# Patient Record
Sex: Female | Born: 1940 | Race: Asian | Hispanic: No | Smoking: Never smoker
Health system: Southern US, Community
[De-identification: ages and names within clinical notes are randomized; demographics above are authoritative.]

## PROBLEM LIST (undated history)

## (undated) DIAGNOSIS — M1990 Unspecified osteoarthritis, unspecified site: Secondary | ICD-10-CM

## (undated) DIAGNOSIS — E785 Hyperlipidemia, unspecified: Secondary | ICD-10-CM

## (undated) DIAGNOSIS — R011 Cardiac murmur, unspecified: Secondary | ICD-10-CM

## (undated) DIAGNOSIS — I10 Essential (primary) hypertension: Secondary | ICD-10-CM

## (undated) DIAGNOSIS — E119 Type 2 diabetes mellitus without complications: Secondary | ICD-10-CM

## (undated) HISTORY — DX: Cardiac murmur, unspecified: R01.1

## (undated) HISTORY — PX: ABDOMINAL HYSTERECTOMY: SHX81

## (undated) HISTORY — DX: Hyperlipidemia, unspecified: E78.5

---

## 2002-12-19 ENCOUNTER — Encounter: Payer: Self-pay | Admitting: Internal Medicine

## 2002-12-19 ENCOUNTER — Ambulatory Visit (HOSPITAL_COMMUNITY): Admission: RE | Admit: 2002-12-19 | Discharge: 2002-12-19 | Payer: Self-pay | Admitting: Internal Medicine

## 2002-12-22 ENCOUNTER — Encounter: Payer: Self-pay | Admitting: Internal Medicine

## 2002-12-22 ENCOUNTER — Ambulatory Visit (HOSPITAL_COMMUNITY): Admission: RE | Admit: 2002-12-22 | Discharge: 2002-12-22 | Payer: Self-pay | Admitting: Internal Medicine

## 2003-08-10 ENCOUNTER — Ambulatory Visit (HOSPITAL_COMMUNITY): Admission: RE | Admit: 2003-08-10 | Discharge: 2003-08-10 | Payer: Self-pay | Admitting: Internal Medicine

## 2003-09-20 ENCOUNTER — Ambulatory Visit (HOSPITAL_COMMUNITY): Admission: RE | Admit: 2003-09-20 | Discharge: 2003-09-20 | Payer: Self-pay | Admitting: Internal Medicine

## 2004-05-05 ENCOUNTER — Ambulatory Visit: Payer: Self-pay | Admitting: Internal Medicine

## 2004-05-27 ENCOUNTER — Ambulatory Visit: Payer: Self-pay | Admitting: *Deleted

## 2004-05-30 ENCOUNTER — Ambulatory Visit: Payer: Self-pay | Admitting: Internal Medicine

## 2004-08-04 ENCOUNTER — Ambulatory Visit: Payer: Self-pay | Admitting: Internal Medicine

## 2004-12-11 ENCOUNTER — Ambulatory Visit: Payer: Self-pay | Admitting: Internal Medicine

## 2005-01-05 ENCOUNTER — Ambulatory Visit: Payer: Self-pay | Admitting: Internal Medicine

## 2005-04-13 ENCOUNTER — Ambulatory Visit: Payer: Self-pay | Admitting: Internal Medicine

## 2009-02-05 ENCOUNTER — Encounter: Admission: RE | Admit: 2009-02-05 | Discharge: 2009-02-05 | Payer: Self-pay | Admitting: Specialist

## 2012-06-08 ENCOUNTER — Encounter (HOSPITAL_COMMUNITY): Payer: Self-pay | Admitting: Pharmacy Technician

## 2012-06-14 ENCOUNTER — Ambulatory Visit (HOSPITAL_COMMUNITY)
Admission: RE | Admit: 2012-06-14 | Discharge: 2012-06-14 | Disposition: A | Discharge status: Home / Self Care | Payer: Medicare Other | Source: Ambulatory Visit | Attending: Orthopedic Surgery | Admitting: Orthopedic Surgery

## 2012-06-14 ENCOUNTER — Encounter (HOSPITAL_COMMUNITY): Payer: Self-pay

## 2012-06-14 ENCOUNTER — Encounter (HOSPITAL_COMMUNITY)
Admission: RE | Admit: 2012-06-14 | Discharge: 2012-06-14 | Disposition: A | Discharge status: Home / Self Care | Payer: Medicare Other | Source: Ambulatory Visit | Attending: Orthopedic Surgery | Admitting: Orthopedic Surgery

## 2012-06-14 ENCOUNTER — Other Ambulatory Visit: Payer: Self-pay

## 2012-06-14 DIAGNOSIS — M4120 Other idiopathic scoliosis, site unspecified: Secondary | ICD-10-CM | POA: Insufficient documentation

## 2012-06-14 HISTORY — DX: Essential (primary) hypertension: I10

## 2012-06-14 HISTORY — DX: Unspecified osteoarthritis, unspecified site: M19.90

## 2012-06-14 HISTORY — DX: Type 2 diabetes mellitus without complications: E11.9

## 2012-06-14 LAB — URINALYSIS, ROUTINE W REFLEX MICROSCOPIC
Glucose, UA: NEGATIVE mg/dL
Hgb urine dipstick: NEGATIVE
Ketones, ur: NEGATIVE mg/dL
Leukocytes, UA: NEGATIVE
pH: 7 (ref 5.0–8.0)

## 2012-06-14 LAB — CBC
HCT: 39.2 % (ref 36.0–46.0)
MCH: 32 pg (ref 26.0–34.0)
MCHC: 34.2 g/dL (ref 30.0–36.0)
MCV: 93.6 fL (ref 78.0–100.0)
RDW: 12.7 % (ref 11.5–15.5)
WBC: 6.8 10*3/uL (ref 4.0–10.5)

## 2012-06-14 LAB — BASIC METABOLIC PANEL
BUN: 19 mg/dL (ref 6–23)
CO2: 25 mEq/L (ref 19–32)
Chloride: 102 mEq/L (ref 96–112)
Creatinine, Ser: 0.63 mg/dL (ref 0.50–1.10)
Glucose, Bld: 105 mg/dL — ABNORMAL HIGH (ref 70–99)

## 2012-06-14 LAB — SURGICAL PCR SCREEN
MRSA, PCR: NEGATIVE
Staphylococcus aureus: NEGATIVE

## 2012-06-14 NOTE — Patient Instructions (Addendum)
20      Your procedure is scheduled on:  Monday 06/20/2012 at 220 pm  Report to [facility] at 1200 pm  Call this number if you have problems the morning of surgery: [phone]   Remember:   Do not eat food after midnight! FROM MIDNIGHT UNTIL 0820 AM MORNING OF SURGERY ,YOU CAN HAVE CLEAR LIQUIDS THEN NOTHING UNTIL AFTER YOUR SURGERY! (clear liquid diet provided)  Take these medicines the morning of surgery with A SIP OF WATER: NONE   Do not bring valuables to the hospital.  .  Leave suitcase in the car. After surgery it may be brought to your room.  For patients admitted to the hospital, checkout time is 11:00 AM the day of              Discharge.    Special Instructions: See [facility] Preparing  For Surgery Instruction Sheet. Do not wear jewelry, lotions powders, perfumes. Women do not shave legs or underarms for 12 hours before showers. Contacts, partial plates, or dentures may not be worn into surgery.                          Patients discharged the day of surgery will not be allowed to drive home.   If going home the same day of surgery, must have someone stay with you first 24 hrs.at home and arrange for someone to drive you home from the Hospital.                Please read over the following fact sheets that you were given: MRSA              INFORMATION, Blood Transfusion sheet, Sleep Apnea Sheet, Incentive Spirometry Sheet               Telford Nab.Shalondra Wunschel,RN,BSN [phone]

## 2012-06-19 NOTE — H&P (Signed)
TOTAL KNEE ADMISSION H&P  Patient is being admitted for right total knee arthroplasty.  Subjective:  Chief Complaint:right knee pain.  HPI: Alexandra Bean, 71 y.o. female, has a history of pain and functional disability in the right knee due to arthritis and has failed non-surgical conservative treatments for greater than 12 weeks to includeNSAID's and/or analgesics, corticosteriod injections and activity modification.  Onset of symptoms was gradual, starting 2 years ago with gradually worsening course since that time. The patient noted no past surgery on the right knee(s).  Patient currently rates pain in the right knee(s) at 7 out of 10 with activity. Patient has worsening of pain with activity and weight bearing, pain that interferes with activities of daily living and pain with passive range of motion.  Patient has evidence of periarticular osteophytes and joint space narrowing by imaging studies. Risks, benefits and expectations were discussed with the patient. Patient understand the risks, benefits and expectations and wishes to proceed with surgery.  D/C Plans:  Home with HHPT  Post-op Meds:  No Rx given   Tranexamic Acid:  To be given  Decadron:   Not to be given - DM    Past Medical History  Diagnosis Date  . Hypertension   . Diabetes mellitus without complication   . Arthritis     Past Surgical History  Procedure Date  . 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 Smoking status: Not on file  . Smokeless tobacco: Not on file  . Alcohol Use: No      Review of Systems  Constitutional: Negative.   HENT: Negative.   Eyes: Negative.   Respiratory: Negative.   Cardiovascular: Negative.   Gastrointestinal: Negative.   Genitourinary: Negative.   Musculoskeletal: Positive for myalgias and joint pain.  Skin: Negative.   Neurological:  Negative.   Endo/Heme/Allergies: Negative.   Psychiatric/Behavioral: Negative.     Objective:  Physical Exam  Constitutional: She is oriented to person, place, and time. She appears well-developed and well-nourished.  HENT:  Head: Normocephalic and atraumatic.  Nose: Nose normal.  Mouth/Throat: Oropharynx is clear and moist.  Eyes: Pupils are equal, round, and reactive to light.  Neck: Neck supple. No JVD present. No tracheal deviation present. No thyromegaly present.  Cardiovascular: Normal rate, regular rhythm, normal heart sounds and intact distal pulses.   Respiratory: Effort normal and breath sounds normal. No stridor. No respiratory distress. She has no wheezes.  GI: Soft. There is no tenderness. There is no guarding.  Musculoskeletal:       Right knee: She exhibits swelling and bony tenderness. She exhibits normal range of motion, no effusion, no deformity, no laceration and no erythema. tenderness found.  Lymphadenopathy:    She has no cervical adenopathy.  Neurological: She is alert and oriented to person, place, and time.  Skin: Skin is warm and dry.  Psychiatric: She has a normal mood and affect.    Vital signs in last 24 hours: BP : 127/70 ; HR : 88 ; Resp : 14 ;   Imaging Review Plain radiographs demonstrate moderate degenerative joint disease of the right knee(s). The overall alignment isneutral. The bone quality appears to be good for age and reported activity level.  Assessment/Plan:  End stage arthritis, right knee   The patient history, physical examination, clinical judgment  of the provider and imaging studies are consistent with end stage degenerative joint disease of the right knee(s) and total knee arthroplasty is deemed medically necessary. The treatment options including medical management, injection therapy arthroscopy and arthroplasty were discussed at length. The risks and benefits of total knee arthroplasty were presented and reviewed. The risks due  to aseptic loosening, infection, stiffness, patella tracking problems, thromboembolic complications and other imponderables were discussed. The patient acknowledged the explanation, agreed to proceed with the plan and consent was signed. Patient is being admitted for inpatient treatment for surgery, pain control, PT, OT, prophylactic antibiotics, VTE prophylaxis, progressive ambulation and ADL's and discharge planning. The patient is planning to be discharged home with home health services.    Alexandra Bean Alexandra Bean   PAC  06/19/2012, 5:49 PM

## 2012-06-20 ENCOUNTER — Encounter (HOSPITAL_COMMUNITY): Payer: Self-pay | Admitting: Anesthesiology

## 2012-06-20 ENCOUNTER — Encounter (HOSPITAL_COMMUNITY): Payer: Self-pay | Admitting: *Deleted

## 2012-06-20 ENCOUNTER — Inpatient Hospital Stay (HOSPITAL_COMMUNITY)
Admission: RE | Admit: 2012-06-20 | Discharge: 2012-06-22 | DRG: 470 | Disposition: A | Discharge status: Home Health | Payer: Medicare Other | Source: Ambulatory Visit | Attending: Orthopedic Surgery | Admitting: Orthopedic Surgery

## 2012-06-20 ENCOUNTER — Inpatient Hospital Stay (HOSPITAL_COMMUNITY): Payer: Medicare Other | Admitting: Anesthesiology

## 2012-06-20 ENCOUNTER — Encounter (HOSPITAL_COMMUNITY)
Admission: RE | Disposition: A | Discharge status: Home Health | Payer: Self-pay | Source: Ambulatory Visit | Attending: Orthopedic Surgery

## 2012-06-20 DIAGNOSIS — D62 Acute posthemorrhagic anemia: Secondary | ICD-10-CM | POA: Diagnosis not present

## 2012-06-20 DIAGNOSIS — M1710 Unilateral primary osteoarthritis, unspecified knee: Principal | ICD-10-CM | POA: Diagnosis present

## 2012-06-20 DIAGNOSIS — D500 Iron deficiency anemia secondary to blood loss (chronic): Secondary | ICD-10-CM

## 2012-06-20 DIAGNOSIS — E119 Type 2 diabetes mellitus without complications: Secondary | ICD-10-CM | POA: Diagnosis present

## 2012-06-20 DIAGNOSIS — E875 Hyperkalemia: Secondary | ICD-10-CM

## 2012-06-20 DIAGNOSIS — Z96659 Presence of unspecified artificial knee joint: Secondary | ICD-10-CM

## 2012-06-20 DIAGNOSIS — E663 Overweight: Secondary | ICD-10-CM

## 2012-06-20 DIAGNOSIS — E871 Hypo-osmolality and hyponatremia: Secondary | ICD-10-CM

## 2012-06-20 DIAGNOSIS — I10 Essential (primary) hypertension: Secondary | ICD-10-CM | POA: Diagnosis present

## 2012-06-20 HISTORY — PX: TOTAL KNEE ARTHROPLASTY: SHX125

## 2012-06-20 LAB — GLUCOSE, CAPILLARY
Glucose-Capillary: 112 mg/dL — ABNORMAL HIGH (ref 70–99)
Glucose-Capillary: 134 mg/dL — ABNORMAL HIGH (ref 70–99)
Glucose-Capillary: 257 mg/dL — ABNORMAL HIGH (ref 70–99)

## 2012-06-20 LAB — TYPE AND SCREEN: ABO/RH(D): O POS

## 2012-06-20 SURGERY — ARTHROPLASTY, KNEE, TOTAL
Anesthesia: Spinal | Site: Knee | Laterality: Right | Wound class: Clean

## 2012-06-20 MED ORDER — FLEET ENEMA 7-19 GM/118ML RE ENEM: 1.0000 | ENEMA | Freq: Once | RECTAL | Status: AC | PRN

## 2012-06-20 MED ORDER — PHENOL 1.4 % MT LIQD: 1.0000 | OROMUCOSAL | Status: DC | PRN

## 2012-06-20 MED ORDER — ACETAMINOPHEN 10 MG/ML IV SOLN
INTRAVENOUS | Status: DC | PRN
  Administered 2012-06-20: 1000 mg via INTRAVENOUS

## 2012-06-20 MED ORDER — ONDANSETRON HCL 4 MG/2ML IJ SOLN
INTRAMUSCULAR | Status: DC | PRN
  Administered 2012-06-20: 4 mg via INTRAVENOUS

## 2012-06-20 MED ORDER — METHOCARBAMOL 500 MG PO TABS: 500.0000 mg | ORAL_TABLET | Freq: Four times a day (QID) | ORAL | Status: DC | PRN

## 2012-06-20 MED ORDER — LACTATED RINGERS IV SOLN: INTRAVENOUS | Status: DC

## 2012-06-20 MED ORDER — 0.9 % SODIUM CHLORIDE (POUR BTL) OPTIME
TOPICAL | Status: DC | PRN
  Administered 2012-06-20: 1000 mL

## 2012-06-20 MED ORDER — HYDROMORPHONE HCL PF 1 MG/ML IJ SOLN
INTRAMUSCULAR | Status: AC
  Filled 2012-06-20: qty 1

## 2012-06-20 MED ORDER — LACTATED RINGERS IV SOLN
INTRAVENOUS | Status: DC | PRN
  Administered 2012-06-20 (×2): via INTRAVENOUS

## 2012-06-20 MED ORDER — ATORVASTATIN CALCIUM 40 MG PO TABS
40.0000 mg | ORAL_TABLET | Freq: Every day | ORAL | Status: DC
  Administered 2012-06-20 – 2012-06-21 (×2): 40 mg via ORAL
  Filled 2012-06-20 (×3): qty 1

## 2012-06-20 MED ORDER — BUPIVACAINE-EPINEPHRINE 0.25% -1:200000 IJ SOLN
INTRAMUSCULAR | Status: AC
  Filled 2012-06-20: qty 1

## 2012-06-20 MED ORDER — CEFAZOLIN SODIUM-DEXTROSE 2-3 GM-% IV SOLR
2.0000 g | Freq: Four times a day (QID) | INTRAVENOUS | Status: AC
  Administered 2012-06-20 – 2012-06-21 (×2): 2 g via INTRAVENOUS
  Filled 2012-06-20 (×3): qty 50

## 2012-06-20 MED ORDER — ONDANSETRON HCL 4 MG/2ML IJ SOLN: 4.0000 mg | Freq: Four times a day (QID) | INTRAMUSCULAR | Status: DC | PRN

## 2012-06-20 MED ORDER — HYDROCODONE-ACETAMINOPHEN 7.5-325 MG PO TABS
1.0000 | ORAL_TABLET | ORAL | Status: DC
  Administered 2012-06-20 – 2012-06-21 (×2): 1 via ORAL
  Administered 2012-06-21 (×4): 2 via ORAL
  Administered 2012-06-21 – 2012-06-22 (×4): 1 via ORAL
  Filled 2012-06-20: qty 2
  Filled 2012-06-20 (×2): qty 1
  Filled 2012-06-20: qty 2
  Filled 2012-06-20: qty 1
  Filled 2012-06-20 (×7): qty 2

## 2012-06-20 MED ORDER — METHOCARBAMOL 100 MG/ML IJ SOLN
500.0000 mg | Freq: Four times a day (QID) | INTRAMUSCULAR | Status: DC | PRN
  Administered 2012-06-20: 500 mg via INTRAVENOUS
  Filled 2012-06-20: qty 5

## 2012-06-20 MED ORDER — ALUM & MAG HYDROXIDE-SIMETH 200-200-20 MG/5ML PO SUSP: 30.0000 mL | ORAL | Status: DC | PRN

## 2012-06-20 MED ORDER — DOCUSATE SODIUM 100 MG PO CAPS
100.0000 mg | ORAL_CAPSULE | Freq: Two times a day (BID) | ORAL | Status: DC
  Administered 2012-06-20 – 2012-06-22 (×4): 100 mg via ORAL

## 2012-06-20 MED ORDER — FERROUS SULFATE 325 (65 FE) MG PO TABS
325.0000 mg | ORAL_TABLET | Freq: Three times a day (TID) | ORAL | Status: DC
  Administered 2012-06-20 – 2012-06-22 (×5): 325 mg via ORAL
  Filled 2012-06-20 (×8): qty 1

## 2012-06-20 MED ORDER — INSULIN ASPART 100 UNIT/ML ~~LOC~~ SOLN
0.0000 [IU] | Freq: Three times a day (TID) | SUBCUTANEOUS | Status: DC
  Administered 2012-06-20: 1 [IU] via SUBCUTANEOUS
  Administered 2012-06-21: 2 [IU] via SUBCUTANEOUS
  Administered 2012-06-21: 1 [IU] via SUBCUTANEOUS
  Administered 2012-06-21: 2 [IU] via SUBCUTANEOUS

## 2012-06-20 MED ORDER — ZOLPIDEM TARTRATE 5 MG PO TABS: 5.0000 mg | ORAL_TABLET | Freq: Every evening | ORAL | Status: DC | PRN

## 2012-06-20 MED ORDER — KETOROLAC TROMETHAMINE 30 MG/ML IJ SOLN
INTRAMUSCULAR | Status: DC | PRN
  Administered 2012-06-20: 30 mg

## 2012-06-20 MED ORDER — AMLODIPINE BESYLATE 10 MG PO TABS
10.0000 mg | ORAL_TABLET | Freq: Every day | ORAL | Status: DC
  Administered 2012-06-20: 10 mg via ORAL
  Filled 2012-06-20: qty 1

## 2012-06-20 MED ORDER — CELECOXIB 200 MG PO CAPS
200.0000 mg | ORAL_CAPSULE | Freq: Two times a day (BID) | ORAL | Status: DC
  Administered 2012-06-20 – 2012-06-22 (×4): 200 mg via ORAL
  Filled 2012-06-20 (×5): qty 1

## 2012-06-20 MED ORDER — CEFAZOLIN SODIUM-DEXTROSE 2-3 GM-% IV SOLR
2.0000 g | INTRAVENOUS | Status: AC
  Administered 2012-06-20: 2 g via INTRAVENOUS

## 2012-06-20 MED ORDER — EPHEDRINE SULFATE 50 MG/ML IJ SOLN
INTRAMUSCULAR | Status: DC | PRN
  Administered 2012-06-20 (×3): 5 mg via INTRAVENOUS

## 2012-06-20 MED ORDER — TRANEXAMIC ACID 100 MG/ML IV SOLN
880.0000 mg | Freq: Once | INTRAVENOUS | Status: AC
  Administered 2012-06-20: 880 mg via INTRAVENOUS
  Filled 2012-06-20: qty 8.8

## 2012-06-20 MED ORDER — SODIUM CHLORIDE 0.9 % IR SOLN
Status: DC | PRN
  Administered 2012-06-20: 1000 mL

## 2012-06-20 MED ORDER — PROPOFOL 10 MG/ML IV EMUL
INTRAVENOUS | Status: DC | PRN
  Administered 2012-06-20: 50 ug/kg/min via INTRAVENOUS

## 2012-06-20 MED ORDER — ONDANSETRON HCL 4 MG PO TABS: 4.0000 mg | ORAL_TABLET | Freq: Four times a day (QID) | ORAL | Status: DC | PRN

## 2012-06-20 MED ORDER — BISACODYL 10 MG RE SUPP: 10.0000 mg | Freq: Every day | RECTAL | Status: DC | PRN

## 2012-06-20 MED ORDER — MIDAZOLAM HCL 5 MG/5ML IJ SOLN
INTRAMUSCULAR | Status: DC | PRN
  Administered 2012-06-20 (×2): 1 mg via INTRAVENOUS

## 2012-06-20 MED ORDER — SODIUM CHLORIDE 0.9 % IV SOLN
INTRAVENOUS | Status: DC
  Administered 2012-06-20 – 2012-06-21 (×2): via INTRAVENOUS
  Filled 2012-06-20 (×6): qty 1000

## 2012-06-20 MED ORDER — BUPIVACAINE IN DEXTROSE 0.75-8.25 % IT SOLN
INTRATHECAL | Status: DC | PRN
  Administered 2012-06-20: 1.4 mL via INTRATHECAL

## 2012-06-20 MED ORDER — BUPIVACAINE-EPINEPHRINE PF 0.25-1:200000 % IJ SOLN
INTRAMUSCULAR | Status: DC | PRN
  Administered 2012-06-20: 50 mL

## 2012-06-20 MED ORDER — METFORMIN HCL 500 MG PO TABS
500.0000 mg | ORAL_TABLET | Freq: Two times a day (BID) | ORAL | Status: DC
  Administered 2012-06-21 – 2012-06-22 (×3): 500 mg via ORAL
  Filled 2012-06-20 (×6): qty 1

## 2012-06-20 MED ORDER — MENTHOL 3 MG MT LOZG: 1.0000 | LOZENGE | OROMUCOSAL | Status: DC | PRN

## 2012-06-20 MED ORDER — FENTANYL CITRATE 0.05 MG/ML IJ SOLN
INTRAMUSCULAR | Status: DC | PRN
  Administered 2012-06-20: 100 ug via INTRAVENOUS

## 2012-06-20 MED ORDER — KETOROLAC TROMETHAMINE 30 MG/ML IJ SOLN
INTRAMUSCULAR | Status: AC
  Filled 2012-06-20: qty 1

## 2012-06-20 MED ORDER — CHLORHEXIDINE GLUCONATE 4 % EX LIQD: 60.0000 mL | Freq: Once | CUTANEOUS | Status: DC

## 2012-06-20 MED ORDER — POLYETHYLENE GLYCOL 3350 17 G PO PACK
17.0000 g | PACK | Freq: Two times a day (BID) | ORAL | Status: DC
  Administered 2012-06-20 – 2012-06-22 (×3): 17 g via ORAL

## 2012-06-20 MED ORDER — OLMESARTAN-AMLODIPINE-HCTZ 40-10-12.5 MG PO TABS
1.0000 | ORAL_TABLET | Freq: Every morning | ORAL | Status: DC
  Administered 2012-06-22: 1 via ORAL

## 2012-06-20 MED ORDER — HYDROMORPHONE HCL PF 1 MG/ML IJ SOLN
0.2500 mg | INTRAMUSCULAR | Status: DC | PRN
  Administered 2012-06-20 (×2): 0.5 mg via INTRAVENOUS

## 2012-06-20 MED ORDER — HYDROMORPHONE HCL PF 1 MG/ML IJ SOLN
0.5000 mg | INTRAMUSCULAR | Status: DC | PRN
  Administered 2012-06-20: 0.5 mg via INTRAVENOUS
  Filled 2012-06-20: qty 1

## 2012-06-20 MED ORDER — RIVAROXABAN 10 MG PO TABS
10.0000 mg | ORAL_TABLET | Freq: Every day | ORAL | Status: DC
  Administered 2012-06-22: 10 mg via ORAL
  Filled 2012-06-20 (×4): qty 1

## 2012-06-20 MED ORDER — DIPHENHYDRAMINE HCL 25 MG PO CAPS: 25.0000 mg | ORAL_CAPSULE | Freq: Four times a day (QID) | ORAL | Status: DC | PRN

## 2012-06-20 SURGICAL SUPPLY — 60 items
ADH SKN CLS APL DERMABOND .7 (GAUZE/BANDAGES/DRESSINGS) ×1
BAG SPEC THK2 15X12 ZIP CLS (MISCELLANEOUS) ×1
BAG ZIPLOCK 12X15 (MISCELLANEOUS) ×2 IMPLANT
BANDAGE ELASTIC 6 VELCRO ST LF (GAUZE/BANDAGES/DRESSINGS) ×2 IMPLANT
BANDAGE ESMARK 6X9 LF (GAUZE/BANDAGES/DRESSINGS) ×1 IMPLANT
BLADE SAW SGTL 13.0X1.19X90.0M (BLADE) ×2 IMPLANT
BNDG CMPR 9X6 STRL LF SNTH (GAUZE/BANDAGES/DRESSINGS) ×1
BNDG ESMARK 6X9 LF (GAUZE/BANDAGES/DRESSINGS) ×2
BONE CEMENT GENTAMICIN (Cement) ×4 IMPLANT
BOWL SMART MIX CTS (DISPOSABLE) ×2 IMPLANT
CEMENT BONE GENTAMICIN 40 (Cement) IMPLANT
CLOTH BEACON ORANGE TIMEOUT ST (SAFETY) ×2 IMPLANT
CUFF TOURN SGL QUICK 34 (TOURNIQUET CUFF) ×2
CUFF TRNQT CYL 34X4X40X1 (TOURNIQUET CUFF) ×1 IMPLANT
DECANTER SPIKE VIAL GLASS SM (MISCELLANEOUS) ×2 IMPLANT
DERMABOND ADVANCED (GAUZE/BANDAGES/DRESSINGS) ×1
DERMABOND ADVANCED .7 DNX12 (GAUZE/BANDAGES/DRESSINGS) ×1 IMPLANT
DRAPE EXTREMITY T 121X128X90 (DRAPE) ×2 IMPLANT
DRAPE POUCH INSTRU U-SHP 10X18 (DRAPES) ×2 IMPLANT
DRAPE U-SHAPE 47X51 STRL (DRAPES) ×2 IMPLANT
DRSG AQUACEL AG ADV 3.5X10 (GAUZE/BANDAGES/DRESSINGS) ×2 IMPLANT
DRSG TEGADERM 4X4.75 (GAUZE/BANDAGES/DRESSINGS) ×2 IMPLANT
DURAPREP 26ML APPLICATOR (WOUND CARE) ×2 IMPLANT
ELECT REM PT RETURN 9FT ADLT (ELECTROSURGICAL) ×2
ELECTRODE REM PT RTRN 9FT ADLT (ELECTROSURGICAL) ×1 IMPLANT
EVACUATOR 1/8 PVC DRAIN (DRAIN) ×2 IMPLANT
FACESHIELD LNG OPTICON STERILE (SAFETY) ×10 IMPLANT
GAUZE SPONGE 2X2 8PLY STRL LF (GAUZE/BANDAGES/DRESSINGS) ×1 IMPLANT
GLOVE BIOGEL PI IND STRL 7.5 (GLOVE) ×1 IMPLANT
GLOVE BIOGEL PI IND STRL 8 (GLOVE) ×1 IMPLANT
GLOVE BIOGEL PI INDICATOR 7.5 (GLOVE) ×1
GLOVE BIOGEL PI INDICATOR 8 (GLOVE) ×1
GLOVE ECLIPSE 8.0 STRL XLNG CF (GLOVE) ×2 IMPLANT
GLOVE ORTHO TXT STRL SZ7.5 (GLOVE) ×4 IMPLANT
GOWN BRE IMP PREV XXLGXLNG (GOWN DISPOSABLE) ×4 IMPLANT
GOWN STRL NON-REIN LRG LVL3 (GOWN DISPOSABLE) ×3 IMPLANT
HANDPIECE INTERPULSE COAX TIP (DISPOSABLE) ×2
IMMOBILIZER KNEE 20 (SOFTGOODS) ×2
IMMOBILIZER KNEE 20 THIGH 36 (SOFTGOODS) IMPLANT
KIT BASIN OR (CUSTOM PROCEDURE TRAY) ×2 IMPLANT
MANIFOLD NEPTUNE II (INSTRUMENTS) ×2 IMPLANT
NDL SAFETY ECLIPSE 18X1.5 (NEEDLE) ×1 IMPLANT
NEEDLE HYPO 18GX1.5 SHARP (NEEDLE) ×2
NS IRRIG 1000ML POUR BTL (IV SOLUTION) ×3 IMPLANT
PACK TOTAL JOINT (CUSTOM PROCEDURE TRAY) ×2 IMPLANT
POSITIONER SURGICAL ARM (MISCELLANEOUS) ×2 IMPLANT
SET HNDPC FAN SPRY TIP SCT (DISPOSABLE) ×1 IMPLANT
SET PAD KNEE POSITIONER (MISCELLANEOUS) ×2 IMPLANT
SPONGE GAUZE 2X2 STER 10/PKG (GAUZE/BANDAGES/DRESSINGS) ×1
SUCTION FRAZIER 12FR DISP (SUCTIONS) ×2 IMPLANT
SUT MNCRL AB 4-0 PS2 18 (SUTURE) ×2 IMPLANT
SUT VIC AB 1 CT1 36 (SUTURE) ×4 IMPLANT
SUT VIC AB 2-0 CT1 27 (SUTURE) ×6
SUT VIC AB 2-0 CT1 TAPERPNT 27 (SUTURE) ×3 IMPLANT
SUT VLOC 180 0 24IN GS25 (SUTURE) ×1 IMPLANT
SYR 50ML LL SCALE MARK (SYRINGE) ×2 IMPLANT
TOWEL OR 17X26 10 PK STRL BLUE (TOWEL DISPOSABLE) ×4 IMPLANT
TRAY FOLEY CATH 14FRSI W/METER (CATHETERS) ×2 IMPLANT
WATER STERILE IRR 1500ML POUR (IV SOLUTION) ×3 IMPLANT
WRAP KNEE MAXI GEL POST OP (GAUZE/BANDAGES/DRESSINGS) ×2 IMPLANT

## 2012-06-20 NOTE — Anesthesia Preprocedure Evaluation (Addendum)
Anesthesia Evaluation  Patient identified by MRN, date of birth, ID band Patient awake    Reviewed: Allergy & Precautions, H&P , NPO status , Patient's Chart, lab work & pertinent test results  Airway Mallampati: II TM Distance: >3 FB Neck ROM: full    Dental No notable dental hx. (+) Teeth Intact and Dental Advisory Given   Pulmonary neg pulmonary ROS,  breath sounds clear to auscultation  Pulmonary exam normal       Cardiovascular Exercise Tolerance: Good hypertension, Pt. on medications negative cardio ROS  Rhythm:regular Rate:Normal     Neuro/Psych negative neurological ROS  negative psych ROS   GI/Hepatic negative GI ROS, Neg liver ROS,   Endo/Other  negative endocrine ROSdiabetes, Well Controlled, Type 2, Oral Hypoglycemic Agents  Renal/GU negative Renal ROS  negative genitourinary   Musculoskeletal   Abdominal   Peds  Hematology negative hematology ROS (+)   Anesthesia Other Findings   Reproductive/Obstetrics negative OB ROS                          Anesthesia Physical Anesthesia Plan  ASA: III  Anesthesia Plan: Spinal   Post-op Pain Management:    Induction:   Airway Management Planned:   Additional Equipment:   Intra-op Plan:   Post-operative Plan:   Informed Consent: I have reviewed the patients History and Physical, chart, labs and discussed the procedure including the risks, benefits and alternatives for the proposed anesthesia with the patient or authorized representative who has indicated his/her understanding and acceptance.   Dental Advisory Given  Plan Discussed with: CRNA and Surgeon  Anesthesia Plan Comments:         Anesthesia Quick Evaluation

## 2012-06-20 NOTE — Op Note (Signed)
NAME:  Alexandra Bean                      MEDICAL RECORD NO.:  [id number]                             FACILITY:  [facility]      PHYSICIAN:  Madlyn Frankel. Charlann Boxer, M.D.  DATE OF BIRTH:  August 05, 1941      DATE OF PROCEDURE:  06/20/2012                                     OPERATIVE REPORT         PREOPERATIVE DIAGNOSIS:  Right knee osteoarthritis.      POSTOPERATIVE DIAGNOSIS:  Right knee osteoarthritis.      FINDINGS:  The patient was noted to have complete loss of cartilage and   bone-on-bone arthritis with associated osteophytes in the medial and patellofemoral compartments of   the knee with a significant synovitis and associated effusion.      PROCEDURE:  Right total knee replacement.      COMPONENTS USED:  DePuy rotating platform posterior stabilized knee   system, a size 2 femur, 2 tibia, 12.5 mm insert, and 35 patellar   button.      SURGEON:  Madlyn Frankel. Charlann Boxer, M.D.      ASSISTANT:  Lanney Gins, PA-C.      ANESTHESIA:  Spinal.      SPECIMENS:  None.      COMPLICATION:  None.      DRAINS:  One Hemovac.  EBL: 100cc      TOURNIQUET TIME:   Total Tourniquet Time Documented: Thigh (Right) - 38 minutes .      The patient was stable to the recovery room.      INDICATION FOR PROCEDURE:  Alexandra Bean is a 71 y.o. female patient of   mine.  The patient had been seen, evaluated, and treated conservatively in the   office with medication, activity modification, and injections.  The patient had   radiographic changes of bone-on-bone arthritis with endplate sclerosis and osteophytes noted.      The patient failed conservative measures including medication, injections, and activity modification, and at this point was ready for more definitive measures.   Based on the radiographic changes and failed conservative measures, the patient   decided to proceed with total knee replacement.  Risks of infection,   DVT, component failure, need for revision surgery, postop course, and   expectations  were all   discussed and reviewed.  Consent was obtained for benefit of pain   relief.      PROCEDURE IN DETAIL:  The patient was brought to the operative theater.   Once adequate anesthesia, preoperative antibiotics, 2 gm of Ancef administered, the patient was positioned supine with the right thigh tourniquet placed.  The  right lower extremity was prepped and draped in sterile fashion.  A time-   out was performed identifying the patient, planned procedure, and   extremity.      The right lower extremity was placed in the [facility] leg holder.  The leg was   exsanguinated, tourniquet elevated to 250 mmHg.  A midline incision was   made followed by median parapatellar arthrotomy.  Following initial   exposure, attention was first directed to the patella.  Precut  measurement was noted to be 18 mm.  I resected down to 13 mm and used a   35 patellar button to restore patellar height as well as cover the cut   surface.      The lug holes were drilled and a metal shim was placed to protect the   patella from retractors and saw blades.      At this point, attention was now directed to the femur.  The femoral   canal was opened with a drill, irrigated to try to prevent fat emboli.  An   intramedullary rod was passed at 3 degrees valgus, 10 mm of bone was   resected off the distal femur.  Following this resection, the tibia was   subluxated anteriorly.  Using the extramedullary guide, 10 mm of bone was resected off   the proximal lateral tibia.  We confirmed the gap would be   stable medially and laterally with a 10 mm insert as well as confirmed   the cut was perpendicular in the coronal plane, checking with an alignment rod.      Once this was done, I sized the femur to be a size 2 in the anterior-   posterior dimension, chose a standard component based on medial and   lateral dimension.  The size 2 rotation block was then pinned in   position anterior referenced using the C-clamp to set  rotation.  The   anterior, posterior, and  chamfer cuts were made without difficulty nor   notching making certain that I was along the anterior cortex to help   with flexion gap stability.      The final box cut was made off the lateral aspect of distal femur.      At this point, the tibia was sized to be a size 2, the size 2 tray was   then pinned in position through the medial third of the tubercle,   drilled, and keel punched.  Trial reduction was now carried with a 2 femur,  2 tibia, a 12.5 mm PS insert, and the 35 patella botton.  The knee was brought to   extension, full extension with good flexion stability with the patella   tracking through the trochlea without application of pressure.  Given   all these findings, the trial components removed.  Final components were   opened and cement was mixed.  The knee was irrigated with normal saline   solution and pulse lavage.  The synovial lining was   then injected with 0.25% Marcaine with epinephrine and 1 cc of Toradol,   total of 61 cc.      The knee was irrigated.  Final implants were then cemented onto clean and   dried cut surfaces of bone with the knee brought to extension with a 12.5 mm trial insert.      Once the cement had fully cured, the excess cement was removed   throughout the knee.  I confirmed I was satisfied with the range of   motion and stability, and the final 12.5 mm PS insert was chosen.  It was   placed into the knee.      The tourniquet had been let down at 37 minutes.  No significant   hemostasis required.  The medium Hemovac drain was placed deep.  The   extensor mechanism was then reapproximated using #1 Vicryl with the knee   in flexion.  The   remaining wound was closed with 2-0 Vicryl  and running 4-0 Monocryl.   The knee was cleaned, dried, dressed sterilely using Dermabond and   Aquacel dressing.  Drain site dressed separately.  The patient was then   brought to recovery room in stable condition,  tolerating the procedure   well.   Please note that Physician Assistant, Lanney Gins, was present for the entirety of the case, and was utilized for pre-operative positioning, peri-operative retractor management, general facilitation of the procedure.  He was also utilized for primary wound closure at the end of the case.              Madlyn Frankel Charlann Boxer, M.D.

## 2012-06-20 NOTE — Transfer of Care (Signed)
Immediate Anesthesia Transfer of Care Note  Patient: Alexandra Bean  Procedure(s) Performed: Procedure(s) (LRB) with comments: TOTAL KNEE ARTHROPLASTY (Right)  Patient Location: PACU  Anesthesia Type:Spinal  Level of Consciousness: awake, alert  and oriented  Airway & Oxygen Therapy: Patient Spontanous Breathing and Patient connected to face mask oxygen  Post-op Assessment: Report given to PACU RN and Post -op Vital signs reviewed and stable  Post vital signs: Reviewed and stable  Complications: No apparent anesthesia complications

## 2012-06-20 NOTE — Interval H&P Note (Signed)
History and Physical Interval Note:  06/20/2012 1:01 PM  Alexandra Bean  has presented today for surgery, with the diagnosis of right knee osteoarthritis  The various methods of treatment have been discussed with the patient and family. After consideration of risks, benefits and other options for treatment, the patient has consented to  Procedure(s) (LRB) with comments: TOTAL KNEE ARTHROPLASTY (Right) as a surgical intervention .  The patient's history has been reviewed, patient examined, no change in status, stable for surgery.  I have reviewed the patient's chart and labs.  Questions were answered to the patient's satisfaction.     Shelda Pal

## 2012-06-20 NOTE — Anesthesia Postprocedure Evaluation (Signed)
  Anesthesia Post-op Note  Patient: Alexandra Bean  Procedure(s) Performed: Procedure(s) (LRB): TOTAL KNEE ARTHROPLASTY (Right)  Patient Location: PACU  Anesthesia Type: Spinal  Level of Consciousness: awake and alert   Airway and Oxygen Therapy: Patient Spontanous Breathing  Post-op Pain: mild  Post-op Assessment: Post-op Vital signs reviewed, Patient's Cardiovascular Status Stable, Respiratory Function Stable, Patent Airway and No signs of Nausea or vomiting  Post-op Vital Signs: stable  Complications: No apparent anesthesia complications

## 2012-06-20 NOTE — Anesthesia Procedure Notes (Signed)
Spinal  Patient location during procedure: OR End time: 06/20/2012 2:06 PM Staffing CRNA/Resident: Enriqueta Shutter Performed by: anesthesiologist and resident/CRNA  Preanesthetic Checklist Completed: patient identified, site marked, surgical consent, pre-op evaluation, timeout performed, IV checked, risks and benefits discussed and monitors and equipment checked Spinal Block Patient position: sitting Prep: Betadine Patient monitoring: heart rate, continuous pulse ox and blood pressure Approach: right paramedian Location: L3-4 Injection technique: single-shot Needle Needle type: Spinocan  Needle gauge: 22 G Needle length: 9 cm Assessment Sensory level: T6 Additional Notes Expiration date of kit checked and confirmed. Patient tolerated procedure well, without complications.

## 2012-06-21 ENCOUNTER — Encounter (HOSPITAL_COMMUNITY): Payer: Self-pay | Admitting: *Deleted

## 2012-06-21 DIAGNOSIS — D500 Iron deficiency anemia secondary to blood loss (chronic): Secondary | ICD-10-CM

## 2012-06-21 DIAGNOSIS — E875 Hyperkalemia: Secondary | ICD-10-CM

## 2012-06-21 DIAGNOSIS — E663 Overweight: Secondary | ICD-10-CM

## 2012-06-21 LAB — BASIC METABOLIC PANEL
BUN: 18 mg/dL (ref 6–23)
CO2: 22 mEq/L (ref 19–32)
Calcium: 8.9 mg/dL (ref 8.4–10.5)
Creatinine, Ser: 0.69 mg/dL (ref 0.50–1.10)
GFR calc non Af Amer: 86 mL/min — ABNORMAL LOW (ref >90)
Glucose, Bld: 144 mg/dL — ABNORMAL HIGH (ref 70–99)
Sodium: 135 mEq/L (ref 135–145)

## 2012-06-21 LAB — GLUCOSE, CAPILLARY: Glucose-Capillary: 125 mg/dL — ABNORMAL HIGH (ref 70–99)

## 2012-06-21 LAB — CBC
Hemoglobin: 11.9 g/dL — ABNORMAL LOW (ref 12.0–15.0)
MCH: 31.6 pg (ref 26.0–34.0)
MCHC: 34 g/dL (ref 30.0–36.0)
MCV: 92.8 fL (ref 78.0–100.0)
RBC: 3.77 MIL/uL — ABNORMAL LOW (ref 3.87–5.11)

## 2012-06-21 NOTE — Evaluation (Signed)
Occupational Therapy Evaluation Patient Details Name: Alexandra Bean MRN: [id number] DOB: 19-Jan-1941 Today's Date: 06/21/2012 Time: 4098-1191 OT Time Calculation (min): 32 min  OT Assessment / Plan / Recommendation Clinical Impression  Pt doing well POD 1 RTKR. All education completed. Pt will have prn A from family/friends upon d/c.    OT Assessment  Patient does not need any further OT services    Follow Up Recommendations  No OT follow up    Barriers to Discharge      Equipment Recommendations  Rolling walker with 5" wheels;3 in 1 bedside comode    Recommendations for Other Services    Frequency       Precautions / Restrictions Precautions Precautions: Knee Precaution Comments: Pt able to perform SLR. Required Braces or Orthoses: Knee Immobilizer - Right Knee Immobilizer - Right: Discontinue once straight leg raise with < 10 degree lag Restrictions RLE Weight Bearing: Weight bearing as tolerated   Pertinent Vitals/Pain Pt did not rate pain 2* language barrier but did not appear to be in pain or distress. Pt was repositioned and cold applied.    ADL  Grooming: Performed;Wash/dry hands;Supervision/safety Where Assessed - Grooming: Supported standing Upper Body Bathing: Simulated;Set up Where Assessed - Upper Body Bathing: Unsupported sitting Lower Body Bathing: Simulated;Minimal assistance Where Assessed - Lower Body Bathing: Supported sit to stand Upper Body Dressing: Simulated;Set up Where Assessed - Upper Body Dressing: Unsupported sitting Lower Body Dressing: Simulated;Minimal assistance Where Assessed - Lower Body Dressing: Supported sit to stand Toilet Transfer: Research scientist (life sciences) Method: Sit to Barista: Comfort height toilet Toileting - Architect and Hygiene: Performed;Supervision/safety Where Assessed - Engineer, mining and Hygiene: Sit to stand from 3-in-1 or toilet Equipment Used:  Rolling walker Transfers/Ambulation Related to ADLs: Pt ambulated to the bathroom with minguard A and RW.    OT Diagnosis:    OT Problem List:   OT Treatment Interventions:     OT Goals    Visit Information  Last OT Received On: 06/21/12 Assistance Needed: +1    Subjective Data  Subjective: Pt does not speak english- info relayed via interpreter.   Prior Functioning     Home Living Lives With: Son Available Help at Discharge: Available PRN/intermittently;Friend(s);Family Type of Home: House Home Access: Level entry Home Layout: One level Bathroom Shower/Tub: Network engineer: None Prior Function Level of Independence: Independent Communication Communication: Prefers language other than English ([place] Dominant Hand: Right         Vision/Perception     Cognition  Overall Cognitive Status: Appears within functional limits for tasks assessed/performed Arousal/Alertness: Awake/alert Orientation Level: Appears intact for tasks assessed Behavior During Session: [facility] for tasks performed    Extremity/Trunk Assessment Right Upper Extremity Assessment RUE ROM/Strength/Tone: [facility] for tasks assessed Left Upper Extremity Assessment LUE ROM/Strength/Tone: WFL for tasks assessed     Mobility Bed Mobility Bed Mobility: Supine to Sit Supine to Sit: 5: Supervision;HOB elevated;With rails Details for Bed Mobility Assistance: verbal cues for technique Transfers Sit to Stand: 4: Min guard;With upper extremity assist;From bed;From toilet Stand to Sit: 4: Min guard;With upper extremity assist;With armrests;To chair/3-in-1;To toilet Details for Transfer Assistance: min multimodal cues for safe technique.     Shoulder Instructions     Exercise     Balance     End of Session OT - End of Session Activity Tolerance: Patient tolerated treatment well Patient left: in chair;with call bell/phone within reach;with  family/visitor present  GO  Libbey Duce A OTR/L [phone] 06/21/2012, 2:31 PM

## 2012-06-21 NOTE — Care Management Note (Addendum)
    Page 1 of 2   06/22/2012     2:37:39 PM   CARE MANAGEMENT NOTE 06/22/2012  Patient:  [facility]   Account Number:  [id number]  Date Initiated:  06/21/2012  Documentation initiated by:  Colleen Can  Subjective/Objective Assessment:   DX RT KNEE OSTEOARTHRITIS; TOTAL KNEE  REPLACEMNT  [facility] PRE-ARRANGED WITH [facility] SERVICES     Action/Plan:   CM SPOKE WITH PATIENT'S CAREGIVER . PLANS ARE FOR PATIENT TO RETURN TO HER HOME WHERE SON AND FRIENDS WILL BE CAREGIVERS. PT WILL NEED RW AND POSSIBLY 3N1   Anticipated DC Date:  06/21/2012   Anticipated DC Plan:  HOME W HOME HEALTH SERVICES  In-house referral  Clinical Social Worker      DC Associate Professor  CM consult      [facility] Choice  HOME HEALTH  DURABLE MEDICAL EQUIPMENT   Choice offered to / List presented to:  C-1 Patient   DME arranged  3-N-1  Levan Hurst      DME agency  [facility]     HH arranged  HH-2 PT      [facility] agency  [facility]   Status of service:  Completed, signed off Medicare Important Message given?  NA - LOS <3 / Initial given by admissions (If response is "NO", the following Medicare IM given date fields will be blank) Date Medicare IM given:   Date Additional Medicare IM given:    Discharge Disposition:  HOME W HOME HEALTH SERVICES  Per UR Regulation:  Reviewed for med. necessity/level of care/duration of stay  If discussed at Long Length of Stay Meetings, dates discussed:    Comments:  06/22/2012 Raynelle Bring BSN CCM [phone] DME has been delived to pt's room. [facility] serrvices will start tomorrow 10/31.2013.

## 2012-06-21 NOTE — Evaluation (Signed)
Physical Therapy Evaluation Patient Details Name: Alexandra Bean MRN: [id number] DOB: 1941-08-11 Today's Date: 06/21/2012 Time: 7829-5621 PT Time Calculation (min): 13 min  PT Assessment / Plan / Recommendation Clinical Impression  Pt s/p R TKR.  Pt speaks [place] however agreeable for friend in room to translate.  Pt plans on d/c home.  Friend reports no stairs or steps at home and pt lives with her son.  Pt would benefit from acute PT services in order to improve independence with transfers and ambulation by increasing strength and ROM of R [place] to prepare for d/c home with son.    PT Assessment  Patient needs continued PT services    Follow Up Recommendations  Home health PT    Does the patient have the potential to tolerate intense rehabilitation      Barriers to Discharge        Equipment Recommendations  Rolling walker with 5" wheels    Recommendations for Other Services     Frequency 7X/week    Precautions / Restrictions Precautions Precautions: Knee Precaution Comments: pt able to perform SLR Required Braces or Orthoses: Knee Immobilizer - Right Knee Immobilizer - Right: Discontinue once straight leg raise with < 10 degree lag Restrictions RLE Weight Bearing: Weight bearing as tolerated   Pertinent Vitals/Pain 2/10 faces, ice applied, repositioned in chair      Mobility  Bed Mobility Bed Mobility: Supine to Sit Supine to Sit: 5: Supervision Details for Bed Mobility Assistance: verbal cues for technique Transfers Transfers: Stand to Sit;Sit to Stand Sit to Stand: 4: Min guard;With upper extremity assist;From bed Stand to Sit: 4: Min guard;With upper extremity assist;To chair/3-in-1 Details for Transfer Assistance: verbal and visual cues for safe technique Ambulation/Gait Ambulation/Gait Assistance: 4: Min guard Ambulation Distance (Feet): 40 Feet Assistive device: Rolling walker Ambulation/Gait Assistance Details: verbal and visual cues for sequence and RW  distance, turning toward unaffected Maheu Gait Pattern: Step-to pattern;Step-through pattern;Antalgic    Shoulder Instructions     Exercises     PT Diagnosis: Difficulty walking;Acute pain  PT Problem List: Decreased strength;Decreased range of motion;Decreased mobility;Decreased knowledge of precautions;Decreased knowledge of use of DME;Pain PT Treatment Interventions: DME instruction;Gait training;Functional mobility training;Therapeutic activities;Therapeutic exercise;Patient/family education;Stair training   PT Goals Acute Rehab PT Goals PT Goal Formulation: With patient Time For Goal Achievement: 06/28/12 Potential to Achieve Goals: Good Pt will go Supine/Side to Sit: with modified independence PT Goal: Supine/Side to Sit - Progress: Goal set today Pt will go Sit to Supine/Side: with modified independence PT Goal: Sit to Supine/Side - Progress: Goal set today Pt will go Sit to Stand: with modified independence PT Goal: Sit to Stand - Progress: Goal set today Pt will go Stand to Sit: with modified independence PT Goal: Stand to Sit - Progress: Goal set today Pt will Ambulate: 51 - 150 feet;with modified independence;with least restrictive assistive device PT Goal: Ambulate - Progress: Goal set today Pt will Perform Home Exercise Program: with supervision, verbal cues required/provided PT Goal: Perform Home Exercise Program - Progress: Goal set today  Visit Information  Last PT Received On: 06/21/12 Assistance Needed: +1    Subjective Data  Subjective: pt agreeable for friend to translate   Prior Functioning  Home Living Lives With: Son Available Help at Discharge: Available PRN/intermittently;Friend(s);Family Type of Home: House Home Access: Level entry Home Layout: One level Home Adaptive Equipment: None Prior Function Level of Independence: Independent Communication Communication: No difficulties;Other (comment) (Speaks vietnamese, friend in room assisted with  interpreting)    Cognition  Overall Cognitive Status: Appears within functional limits for tasks assessed/performed Arousal/Alertness: Awake/alert Orientation Level: Appears intact for tasks assessed Behavior During Session: [facility] for tasks performed    Extremity/Trunk Assessment Right Upper Extremity Assessment RUE ROM/Strength/Tone: [facility] for tasks assessed Left Upper Extremity Assessment LUE ROM/Strength/Tone: WFL for tasks assessed Right Lower Extremity Assessment RLE ROM/Strength/Tone: Deficits RLE ROM/Strength/Tone Deficits: good quad contraction, able to perform SLR, ROM TBA   Balance    End of Session PT - End of Session Activity Tolerance: Patient tolerated treatment well Patient left: in chair;with call bell/phone within reach;with family/visitor present  GP     Kion Huntsberry,[name] 06/21/2012, 11:12 AM Pager: [phone]

## 2012-06-21 NOTE — Progress Notes (Signed)
Physical Therapy Treatment Patient Details Name: Alexandra Bean MRN: [id number] DOB: Nov 09, 1940 Today's Date: 06/21/2012 Time: 4098-1191 PT Time Calculation (min): 31 min  PT Assessment / Plan / Recommendation Comments on Treatment Session  POD # 1 pm session. Family present to assist with translation.  Amb pt in hallway then assisted pt back to bed to perform TKR TE's.     Follow Up Recommendations  Home health PT     Does the patient have the potential to tolerate intense rehabilitation     Barriers to Discharge        Equipment Recommendations  Rolling walker with 5" wheels;3 in 1 bedside comode;Other (comment) (YOUTH RW and 3:1)    Recommendations for Other Services    Frequency 7X/week   Plan Discharge plan remains appropriate    Precautions / Restrictions Precautions Precautions: Knee Precaution Comments: Pt able to perform 10 active SLR Restrictions Weight Bearing Restrictions: No RLE Weight Bearing: Weight bearing as tolerated    Pertinent Vitals/Pain No c/o pain    Mobility  Bed Mobility Bed Mobility: Sit to Supine Supine to Sit: 5: Supervision;HOB elevated;With rails Sit to Supine: 5: Supervision Details for Bed Mobility Assistance: increased time  Transfers Transfers: Sit to Stand;Stand to Sit Sit to Stand: 4: Min guard;5: Supervision Stand to Sit: 5: Supervision;4: Min guard Details for Transfer Assistance: VC's for safety as pt is impulsive  Ambulation/Gait Ambulation/Gait Assistance: 4: Min guard Ambulation Distance (Feet): 85 Feet Assistive device: Rolling walker Ambulation/Gait Assistance Details: 25% VC's on safety with turns and proper walker to self distance Gait Pattern: Step-through pattern;Trunk flexed    Exercises Total Joint Exercises Ankle Circles/Pumps: AROM;Both;10 reps;Supine Quad Sets: AROM;Both;10 reps;Supine Gluteal Sets: AROM;Both;10 reps;Supine Towel Squeeze: AROM;Both;10 reps;Supine Short Arc Quad: AROM;Right;10  reps;Supine Heel Slides: AAROM;Right;10 reps;Supine Hip ABduction/ADduction: AROM;Right;10 reps;Supine Straight Leg Raises: AROM;Right;10 reps;Supine    PT Goals   progressing    Visit Information  Last PT Received On: 06/21/12 Assistance Needed: +1    Subjective Data      Cognition  Overall Cognitive Status: Appears within functional limits for tasks assessed/performed Arousal/Alertness: Awake/alert Orientation Level: Appears intact for tasks assessed Behavior During Session: [facility] for tasks performed    Balance   good  End of Session PT - End of Session Equipment Utilized During Treatment: Gait belt Activity Tolerance: Patient tolerated treatment well Patient left: in bed;with call bell/phone within reach;with family/visitor present   Felecia Shelling  PTA [facility]  Acute  Rehab Pager     [phone]

## 2012-06-21 NOTE — Progress Notes (Signed)
Utilization review completed.  

## 2012-06-21 NOTE — Progress Notes (Signed)
   Subjective: 1 Day Post-Op Procedure(s) (LRB): TOTAL KNEE ARTHROPLASTY (Right)   Patient reports pain as mild, pain well controlled. No events throughout the night.   Objective:   VITALS:   Filed Vitals:   06/21/12 0709  BP: 111/68  Pulse: 66  Temp: 98.1 F (36.7 C)  Resp: 14    Neurovascular intact Dorsiflexion/Plantar flexion intact Incision: dressing C/D/I No cellulitis present Compartment soft  LABS  Basename 06/21/12 0435  HGB 11.9*  HCT 35.0*  WBC 13.5*  PLT 247     Basename 06/21/12 0435  NA 135  K 5.5*  BUN 18  CREATININE 0.69  GLUCOSE 144*     Assessment/Plan: 1 Day Post-Op Procedure(s) (LRB): TOTAL KNEE ARTHROPLASTY (Right) HV drain d/c'ed Foley cath d/c'ed Advance diet Up with therapy D/C IV fluids Discharge home with home health eventually when ready.  Expected ABLA  Treated with iron and will observe  Overweight (BMI 25-29.9)  Estimated Body mass index is 26.05 kg/(m^2) as calculated from the following:   Height as of this encounter: 4\' 11" (1.499 m).   Weight as of this encounter: 129 lb(58.514 kg). Patient also counseled that weight may inhibit the healing process Patient counseled that losing weight will help with future health issues  Hyperkalemia Will d/c all potassium, including IV fluid which contain it as well.    Anastasio Auerbach Jaystin Mcgarvey   PAC  06/21/2012, 1:28 PM

## 2012-06-22 DIAGNOSIS — E871 Hypo-osmolality and hyponatremia: Secondary | ICD-10-CM

## 2012-06-22 LAB — BASIC METABOLIC PANEL
BUN: 28 mg/dL — ABNORMAL HIGH (ref 6–23)
CO2: 21 mEq/L (ref 19–32)
Chloride: 104 mEq/L (ref 96–112)
Creatinine, Ser: 0.83 mg/dL (ref 0.50–1.10)
GFR calc Af Amer: 80 mL/min — ABNORMAL LOW (ref >90)
Glucose, Bld: 123 mg/dL — ABNORMAL HIGH (ref 70–99)
Potassium: 4.9 mEq/L (ref 3.5–5.1)

## 2012-06-22 LAB — CBC
HCT: 28.9 % — ABNORMAL LOW (ref 36.0–46.0)
Hemoglobin: 9.8 g/dL — ABNORMAL LOW (ref 12.0–15.0)
MCV: 94.4 fL (ref 78.0–100.0)
RBC: 3.06 MIL/uL — ABNORMAL LOW (ref 3.87–5.11)
WBC: 11.6 10*3/uL — ABNORMAL HIGH (ref 4.0–10.5)

## 2012-06-22 MED ORDER — METHOCARBAMOL 500 MG PO TABS: 500.0000 mg | ORAL_TABLET | Freq: Four times a day (QID) | ORAL | Status: DC | PRN

## 2012-06-22 MED ORDER — HYDROCODONE-ACETAMINOPHEN 7.5-325 MG PO TABS: 1.0000 | ORAL_TABLET | ORAL | Status: DC | PRN

## 2012-06-22 MED ORDER — ASPIRIN EC 325 MG PO TBEC: 325.0000 mg | DELAYED_RELEASE_TABLET | Freq: Two times a day (BID) | ORAL | Status: DC

## 2012-06-22 MED ORDER — FERROUS SULFATE 325 (65 FE) MG PO TABS: 325.0000 mg | ORAL_TABLET | Freq: Three times a day (TID) | ORAL | Status: DC

## 2012-06-22 MED ORDER — DIPHENHYDRAMINE HCL 25 MG PO CAPS: 25.0000 mg | ORAL_CAPSULE | Freq: Four times a day (QID) | ORAL | Status: DC | PRN

## 2012-06-22 MED ORDER — POLYETHYLENE GLYCOL 3350 17 G PO PACK: 17.0000 g | PACK | Freq: Two times a day (BID) | ORAL | Status: DC

## 2012-06-22 MED ORDER — DSS 100 MG PO CAPS: 100.0000 mg | ORAL_CAPSULE | Freq: Two times a day (BID) | ORAL | Status: DC

## 2012-06-22 NOTE — Progress Notes (Signed)
Pt to d/c home with home health. AVS reviewed with a telephone interpreter. Pt confirms understanding of new medications and when to continue home medications. Pt understands the need for a  follow-up appt with Dr. Charlann Boxer in two weeks and informed us that one is already scheduled.  Pt remains hemodynamically stable. No signs and symptoms of distress. Educated pt to return to ER in the case of SOB, dizziness, or chest pain. Pt educated about signs and symptoms of infection at surgical site and when to call Dr. Nilsa Nutting office. Equipment to be delivered to room before d/c.

## 2012-06-22 NOTE — Progress Notes (Signed)
Physical Therapy Treatment Patient Details Name: Alexandra Bean MRN: [id number] DOB: 1941-02-15 Today's Date: 06/22/2012 Time: 1001-1027 PT Time Calculation (min): 26 min  PT Assessment / Plan / Recommendation Comments on Treatment Session  Son in room on arrival and reported 10 steps in home.  Pt ambulated and then performed 2 steps as well as exercises.  Interpreter called to clarify stairs at home and pt reports stairs however she does not need to do them and none to enter home.  Pt did not have any questions prior to d/c.    Follow Up Recommendations  Home health PT     Does the patient have the potential to tolerate intense rehabilitation     Barriers to Discharge        Equipment Recommendations  Rolling walker with 5" wheels;3 in 1 bedside comode;Other (comment) (youth)    Recommendations for Other Services    Frequency     Plan Discharge plan remains appropriate    Precautions / Restrictions Precautions Precautions: Knee Precaution Comments: Pt able to perform 10 active SLR Restrictions RLE Weight Bearing: Weight bearing as tolerated   Pertinent Vitals/Pain 4/10 on faces scale, RN reports premedicated    Mobility  Bed Mobility Bed Mobility: Supine to Sit;Sit to Supine Supine to Sit: 6: Modified independent (Device/Increase time) Sit to Supine: 6: Modified independent (Device/Increase time) Details for Bed Mobility Assistance: increased time, pt used UEs to assist R [place] Transfers Transfers: Sit to Stand;Stand to Sit Sit to Stand: 5: Supervision Stand to Sit: 5: Supervision Details for Transfer Assistance: tactile cues for hand placement Ambulation/Gait Ambulation/Gait Assistance: 4: Min guard Ambulation Distance (Feet): 80 Feet Assistive device: Rolling walker Ambulation/Gait Assistance Details: visual cues for safe RW distance and step length Gait Pattern: Step-through pattern;Trunk flexed Stairs: Yes Stairs Assistance: 4: Min guard Stairs Assistance Details  (indicate cue type and reason): visual and tactile cues for correct sequence and technique Stair Management Technique: Step to pattern;Backwards;With walker Number of Stairs: 2     Exercises Total Joint Exercises Ankle Circles/Pumps: AROM;Both;Supine;20 reps Quad Sets: AROM;Both;20 reps Towel Squeeze: AROM;Both;20 reps Short Arc Quad: AROM;Right;20 reps Heel Slides: AROM;Right;20 reps;Seated Hip ABduction/ADduction: AROM;Strengthening;Right;20 reps Straight Leg Raises: AROM;Right;15 reps Goniometric ROM: R knee active flexion 115* seated   PT Diagnosis:    PT Problem List:   PT Treatment Interventions:     PT Goals Acute Rehab PT Goals PT Goal: Supine/Side to Sit - Progress: Met PT Goal: Sit to Supine/Side - Progress: Met PT Goal: Sit to Stand - Progress: Progressing toward goal PT Goal: Stand to Sit - Progress: Progressing toward goal PT Goal: Ambulate - Progress: Progressing toward goal PT Goal: Perform Home Exercise Program - Progress: Progressing toward goal  Visit Information  Last PT Received On: 06/22/12 Assistance Needed: +1    Subjective Data  Subjective: pt to d/c home today   Cognition  Overall Cognitive Status: Appears within functional limits for tasks assessed/performed    Balance     End of Session PT - End of Session Activity Tolerance: Patient tolerated treatment well Patient left: in bed;with call bell/phone within reach;with family/visitor present   GP     Alexandra Bean,Alexandra Bean 06/22/2012, 12:11 PM Pager: [phone]

## 2012-06-22 NOTE — Progress Notes (Signed)
   Subjective: 2 Days Post-Op Procedure(s) (LRB): TOTAL KNEE ARTHROPLASTY (Right)   Patient reports pain as mild, pain well controlled. No events throughout the night.   Objective:   VITALS:   Filed Vitals:   06/22/12 0734  BP: 109/66  Pulse: 60  Temp: 98 F (36.7C)   Resp: 15    Neurovascular intact Dorsiflexion/Plantar flexion intact Incision: dressing C/D/I No cellulitis present Compartment soft  LABS  Basename 06/22/12 0410 06/21/12 0435  HGB 9.8* 11.9*  HCT 28.9* 35.0*  WBC 11.6* 13.5*  PLT 211 247     Basename 06/22/12 0410 06/21/12 0435  NA 133* 135  K 4.9 5.5*  BUN 28* 18  CREATININE 0.83 0.69  GLUCOSE 123* 144*     Assessment/Plan: 2 Days Post-Op Procedure(s) (LRB): TOTAL KNEE ARTHROPLASTY (Right) Up with therapy Discharge home with home health Follow up in 2 weeks at [facility]. Follow-up Information    Follow up with OLIN,Tabor Bartram D in 2 weeks.   Contact information:   [facility] [address] [place] [address] [phone]        Expected ABLA  Treated with iron and will observe  Overweight (BMI 25-29.9) Estimated Body mass index is 26.05 kg/(m^2) as calculated from the following:   Height as of this encounter: 4\' 11" (1.499 m).   Weight as of this encounter: 129 lb(58.514 kg). Patient also counseled that weight may inhibit the healing process Patient counseled that losing weight will help with future health issues  Hyponatremia Will observe     Anastasio Auerbach. Yari Szeliga   PAC  06/22/2012, 9:20 AM

## 2012-06-24 NOTE — Discharge Summary (Signed)
Physician Discharge Summary  Patient ID: Alexandra Bean MRN: [id number] DOB/AGE: [birth year] 71 y.o.  Admit date: 06/20/2012 Discharge date: 06/22/2012   Procedures:  Procedure(s) (LRB): TOTAL KNEE ARTHROPLASTY (Right)  Attending Physician:  Dr. Durene Romans   Admission Diagnoses:   Right knee OA / pain  Discharge Diagnoses:  Principal Problem:  *S/P right TKA Active Problems:  Expected blood loss anemia  Overweight (BMI 25.0-29.9)  Hyponatremia Hypertension   Diabetes mellitus without complication   Arthritis   HPI: Alexandra Bean, 71 y.o. female, has a history of pain and functional disability in the right knee due to arthritis and has failed non-surgical conservative treatments for greater than 12 weeks to includeNSAID's and/or analgesics, corticosteriod injections and activity modification. Onset of symptoms was gradual, starting 2 years ago with gradually worsening course since that time. The patient noted no past surgery on the right knee(s). Patient currently rates pain in the right knee(s) at 7 out of 10 with activity. Patient has worsening of pain with activity and weight bearing, pain that interferes with activities of daily living and pain with passive range of motion. Patient has evidence of periarticular osteophytes and joint space narrowing by imaging studies. Risks, benefits and expectations were discussed with the patient. Patient understand the risks, benefits and expectations and wishes to proceed with surgery.  PCP: Jearld Lesch, MD   Discharged Condition: good  Hospital Course:  Patient underwent the above stated procedure on 06/20/2012. Patient tolerated the procedure well and brought to the recovery room in good condition and subsequently to the floor.  POD #1 BP: 111/68 ; Pulse: 66 ; Temp: 98.1 F (36.7 C) ; Resp: 14  Pt's foley was removed, as well as the hemovac drain removed. IV was changed to a saline lock. Patient reports pain as mild, pain well  controlled. No events throughout the night. Neurovascular intact, dorsiflexion/plantar flexion intact, incision: dressing C/D/I, no cellulitis present and compartment soft.   LABS  Basename  06/21/12 0435   HGB  11.9  HCT  35.0   POD #2  BP: 109/66 ; Pulse: 60 ; Temp: 98 F (36.7C) ; Resp: 15  Patient reports pain as mild, pain well controlled. No events throughout the night. Neurovascular intact, dorsiflexion/plantar flexion intact, incision: dressing C/D/I, no cellulitis present and compartment soft.   LABS  Basename  06/22/12 0410   HGB  9.8  HCT  28.9    Discharge Exam: General appearance: alert, cooperative and no distress Extremities: Homans sign is negative, no sign of DVT, no edema, redness or tenderness in the calves or thighs and no ulcers, gangrene or trophic changes  Disposition: Home-Health Care Svc with follow up in 2 weeks   Follow-up Information    Follow up with Shelda Pal, MD. In 2 weeks.   Contact information:   [facility] [address] [place] [address] [phone]          Discharge Orders    Future Orders Please Complete By Expires   Diet - low sodium heart healthy      Call MD / Call 911      Comments:   If you experience chest pain or shortness of breath, CALL 911 and be transported to the hospital emergency room.  If you develope a fever above 101 F, pus (white drainage) or increased drainage or redness at the wound, or calf pain, call your surgeon's office.   Discharge instructions      Comments:  Maintain surgical dressing for 10-14 days, then replace with gauze and tape. Keep the area dry and clean until follow up. Follow up in 2 weeks at [facility]. Call with any questions or concerns.   Constipation Prevention      Comments:   Drink plenty of fluids.  Prune juice may be helpful.  You may use a stool softener, such as Colace (over the counter) 100 mg twice a day.  Use MiraLax (over the  counter) for constipation as needed.   Increase activity slowly as tolerated      Driving restrictions      Comments:   No driving for 4 weeks   TED hose      Comments:   Use stockings (TED hose) for 2 weeks on both leg(s).  You may remove them at night for sleeping.   Change dressing      Comments:   Maintain surgical dressing for 10-14 days, then change the dressing daily with sterile 4 x 4 inch gauze dressing and tape. Keep the area dry and clean.      Discharge Medication List as of 06/22/2012  9:44 AM    START taking these medications   Details  aspirin EC 325 MG tablet Take 1 tablet (325 mg total) by mouth 2 (two) times daily. X 4 weeks, Starting 06/22/2012, Until Discontinued, No Print    diphenhydrAMINE (BENADRYL) 25 mg capsule Take 1 capsule (25 mg total) by mouth every 6 (six) hours as needed for itching, allergies or sleep., Starting 06/22/2012, Until Discontinued, No Print    docusate sodium 100 MG CAPS Take 100 mg by mouth 2 (two) times daily., Starting 06/22/2012, Until Discontinued, No Print    ferrous sulfate 325 (65 FE) MG tablet Take 1 tablet (325 mg total) by mouth 3 (three) times daily after meals., Starting 06/22/2012, Until Discontinued, No Print    HYDROcodone-acetaminophen (NORCO) 7.5-325 MG per tablet Take 1-2 tablets by mouth every 4 (four) hours as needed for pain., Starting 06/22/2012, Until Discontinued, Print    methocarbamol (ROBAXIN) 500 MG tablet Take 1 tablet (500 mg total) by mouth every 6 (six) hours as needed (muscle spasms)., Starting 06/22/2012, Until Discontinued, Print    polyethylene glycol (MIRALAX / GLYCOLAX) packet Take 17 g by mouth 2 (two) times daily., Starting 06/22/2012, Until Discontinued, No Print      CONTINUE these medications which have NOT CHANGED   Details  Olmesartan-Amlodipine-HCTZ (TRIBENZOR) 40-10-12.5 MG TABS Take 1 tablet by mouth every morning., Until Discontinued, Historical Med    rosuvastatin (CRESTOR) 20 MG  tablet Take 20 mg by mouth every morning., Until Discontinued, Historical Med    calcium carbonate (OS-CAL - DOSED IN MG OF ELEMENTAL CALCIUM) 1250 MG tablet Take 1 tablet by mouth daily., Until Discontinued, Historical Med    metFORMIN (GLUCOPHAGE) 500 MG tablet Take 500 mg by mouth 2 (two) times daily with a meal., Until Discontinued, Historical Med    Naphazoline HCl (CLEAR EYES OP) Place 1 drop into both eyes daily as needed. For dry eyes, Until Discontinued, Historical Med      STOP taking these medications     acetaminophen (TYLENOL) 500 MG tablet Comments:  Reason for Stopping:       HYDROcodone-acetaminophen (VICODIN) 5-500 MG per tablet Comments:  Reason for Stopping:           Signed: Anastasio Auerbach. Jadyn Brasher   PAC  06/24/2012, 9:03 AM

## 2018-09-17 ENCOUNTER — Other Ambulatory Visit: Payer: Self-pay | Admitting: Cardiology

## 2018-09-17 DIAGNOSIS — I10 Essential (primary) hypertension: Secondary | ICD-10-CM

## 2018-09-17 DIAGNOSIS — I340 Nonrheumatic mitral (valve) insufficiency: Secondary | ICD-10-CM

## 2018-10-04 ENCOUNTER — Ambulatory Visit: Payer: Medicare Other

## 2018-10-04 DIAGNOSIS — I340 Nonrheumatic mitral (valve) insufficiency: Secondary | ICD-10-CM | POA: Diagnosis not present

## 2018-10-04 DIAGNOSIS — I10 Essential (primary) hypertension: Secondary | ICD-10-CM

## 2018-10-11 DIAGNOSIS — I340 Nonrheumatic mitral (valve) insufficiency: Secondary | ICD-10-CM | POA: Insufficient documentation

## 2018-10-11 DIAGNOSIS — E785 Hyperlipidemia, unspecified: Secondary | ICD-10-CM | POA: Insufficient documentation

## 2018-10-11 DIAGNOSIS — I10 Essential (primary) hypertension: Secondary | ICD-10-CM | POA: Insufficient documentation

## 2018-10-11 DIAGNOSIS — I119 Hypertensive heart disease without heart failure: Secondary | ICD-10-CM | POA: Insufficient documentation

## 2018-10-11 DIAGNOSIS — R079 Chest pain, unspecified: Secondary | ICD-10-CM | POA: Insufficient documentation

## 2018-10-11 NOTE — Progress Notes (Deleted)
78 y/o [place] female with hypertension, type 2 diabetes melitus, hyperlipidemia, moderate mitral and tricuspid regurgitation, here for 1 year follow up.    Patient recently undewent echocardiogram in 09/2018, that showed no significant change compared to her previous echocardiogram in 09/2017.   Echocardiogram 10/04/2018: Left ventricle cavity is normal in size. Moderate concentric hypertrophy of the left ventricle. Normal global wall motion. Calculated EF 55%. Left atrial cavity is mildly dilated. Moderate (Grade II) mitral regurgitation. Moderate tricuspid regurgitation. Estimated pulmonary artery systolic pressure 28 mmHg.  Lexiscan myoview stress test 10/04/2017:  1. Pharmacologic stress testing was performed with intravenous administration of .4 mg of Lexiscan over a 10-15 seconds infusion. Stress symptoms included dyspnea, dizziness. 2. Exercise capacity not assessed. Stress EKG is non diagnostic for ischemia as it is a pharmacologic stress.  3. The overall quality of the study is excellent. There is no evidence of abnormal lung activity. Stress and rest SPECT images demonstrate homogeneous tracer distribution throughout the myocardium. Gated SPECT imaging reveals normal myocardial thickening and wall motion. The left ventricular ejection fraction was normal (69%).   4. This is a low risk study.   *** 78 y/o [place] female with hypertension, type 2 diabetes melitus, hyperlipidemia, moderate mitral and tricuspid regurgitation, here for 1 year follow up.  ***Moderate mitral and tricuspid regurgitation ***  ***Hypertension: ***  ***DM: ***  ***Hyperlipidemia: ***

## 2018-10-12 ENCOUNTER — Ambulatory Visit: Payer: Medicare Other | Admitting: Cardiology

## 2018-10-12 ENCOUNTER — Telehealth: Payer: Self-pay

## 2018-10-12 NOTE — Telephone Encounter (Signed)
10/12/18 patient missed this appointment. A person answered the phone provided by patient, (Alexandra Bean) patient missed this appointment. Patient will call the office to r/s this appointment.

## 2018-10-13 ENCOUNTER — Encounter: Payer: Self-pay | Admitting: Cardiology

## 2018-10-13 ENCOUNTER — Ambulatory Visit (INDEPENDENT_AMBULATORY_CARE_PROVIDER_SITE_OTHER): Payer: Medicare Other | Admitting: Cardiology

## 2018-10-13 VITALS — BP 129/72 | HR 58 | Ht 62.0 in | Wt 135.2 lb

## 2018-10-13 DIAGNOSIS — I340 Nonrheumatic mitral (valve) insufficiency: Secondary | ICD-10-CM | POA: Diagnosis not present

## 2018-10-13 DIAGNOSIS — I10 Essential (primary) hypertension: Secondary | ICD-10-CM

## 2018-10-13 DIAGNOSIS — E119 Type 2 diabetes mellitus without complications: Secondary | ICD-10-CM | POA: Diagnosis not present

## 2018-10-13 DIAGNOSIS — I361 Nonrheumatic tricuspid (valve) insufficiency: Secondary | ICD-10-CM

## 2018-10-13 DIAGNOSIS — R011 Cardiac murmur, unspecified: Secondary | ICD-10-CM | POA: Insufficient documentation

## 2018-10-13 HISTORY — DX: Cardiac murmur, unspecified: R01.1

## 2018-10-13 NOTE — Progress Notes (Signed)
Patient is here for follow up visit.  Subjective:   @Patient  ID: Alexandra Bean, female    DOB: Feb 15, 1941, 78 y.o.   MRN: [id number]  Chief Complaint  Patient presents with  . Hypertension  . Follow-up    HPI   78 y/o [place] female with hypertension, type 2 DM, mod MR, TR, here for 1 year follow up.   Patient is here with her own translator. She is doing well and denies chest pain, shortness of breath, palpitations, leg edema, orthopnea, PND, TIA/syncope. She has stable hand and feet tingling numbness which is followed by her PCP. She takes occasional pain medictions for joint pain, also followed by PCP. She is due for her physical and labs in the coming few months.    Past Medical History:  Diagnosis Date  . Arthritis   . Diabetes mellitus without complication (HCC)   . Hypertension   . Murmur 10/13/2018    Past Surgical History:  Procedure Laterality Date  . ABDOMINAL HYSTERECTOMY     pt. 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  . TOTAL KNEE ARTHROPLASTY  06/20/2012   Procedure: TOTAL KNEE ARTHROPLASTY;  Surgeon: Shelda PalMatthew D Olin, MD;  Location: [facility];  Service: Orthopedics;  Laterality: Right;    Social History   Socioeconomic History  . Marital status: Widowed    Spouse name: Not on file  . Number of children: 1  . Years of education: Not on file  . Highest education level: Not on file  Occupational History  . Not on file  Social Needs  . Financial resource strain: Not on file  . Food insecurity:    Worry: Not on file    Inability: Not on file  . Transportation needs:    Medical: Not on file    Non-medical: Not on file  Tobacco Use  . Smoking status: Never Smoker  . Smokeless tobacco: Never Used  Substance and Sexual Activity  . Alcohol use: No  . Drug use: No  . Sexual activity: Not on file  Lifestyle  . Physical activity:    Days per week: Not on file    Minutes per session: Not on file  . Stress: Not on file  Relationships   . Social connections:    Talks on phone: Not on file    Gets together: Not on file    Attends religious service: Not on file    Active member of club or organization: Not on file    Attends meetings of clubs or organizations: Not on file    Relationship status: Not on file  . Intimate partner violence:    Fear of current or ex partner: Not on file    Emotionally abused: Not on file    Physically abused: Not on file    Forced sexual activity: Not on file  Other Topics Concern  . Not on file  Social History Narrative  . Not on file    Current Outpatient Medications on File Prior to Visit  Medication Sig Dispense Refill  . aspirin EC 325 MG tablet Take 1 tablet (325 mg total) by mouth 2 (two) times daily. X 4 weeks (Patient taking differently: Take 325 mg by mouth daily. X 4 weeks) 60 tablet 0  . atorvastatin (LIPITOR) 10 MG tablet Take 10 mg by mouth daily.    . calcium carbonate (OS-CAL - DOSED IN MG OF ELEMENTAL CALCIUM) 1250 MG tablet Take 1 tablet by mouth  daily.    . diphenhydrAMINE (BENADRYL) 25 mg capsule Take 1 capsule (25 mg total) by mouth every 6 (six) hours as needed for itching, allergies or sleep. 30 capsule   . HYDROcodone-acetaminophen (NORCO) 7.5-325 MG per tablet Take 1-2 tablets by mouth every 4 (four) hours as needed for pain. 120 tablet 0  . metFORMIN (GLUCOPHAGE) 500 MG tablet Take 500 mg by mouth 2 (two) times daily with a meal.    . methocarbamol (ROBAXIN) 500 MG tablet Take 1 tablet (500 mg total) by mouth every 6 (six) hours as needed (muscle spasms). 50 tablet 0  . Olmesartan-Amlodipine-HCTZ (TRIBENZOR) 40-10-12.5 MG TABS Take 1 tablet by mouth every morning.    . docusate sodium 100 MG CAPS Take 100 mg by mouth 2 (two) times daily. (Patient not taking: Reported on 10/13/2018) 10 capsule   . ferrous sulfate 325 (65 FE) MG tablet Take 1 tablet (325 mg total) by mouth 3 (three) times daily after meals. (Patient not taking: Reported on 10/13/2018)    . Naphazoline  HCl (CLEAR EYES OP) Place 1 drop into both eyes daily as needed. For dry eyes    . polyethylene glycol (MIRALAX / GLYCOLAX) packet Take 17 g by mouth 2 (two) times daily. (Patient not taking: Reported on 10/13/2018) 14 each    No current facility-administered medications on file prior to visit.     Cardiovascular studies:  EKG 10/13/2018: Sinus  Rhythm  Left axis -anterior fascicular block.   Echocardiogram 10/04/2018: Left ventricle cavity is normal in size. Moderate concentric hypertrophy of the left ventricle. Normal global wall motion. Calculated EF 55%. Left atrial cavity is mildly dilated. Moderate (Grade II) mitral regurgitation. Moderate tricuspid regurgitation. Estimated pulmonary artery systolic pressure 28  mmHg. No significant change compared to previous study in 09/2017.  Lexiscan myoview stress test 10/04/2017:  1. Pharmacologic stress testing was performed with intravenous administration of .4 mg of Lexiscan over a 10-15 seconds infusion. Stress symptoms included dyspnea, dizziness. 2. Exercise capacity not assessed. Stress EKG is non diagnostic for ischemia as it is a pharmacologic stress.  3. The overall quality of the study is excellent. There is no evidence of abnormal lung activity. Stress and rest SPECT images demonstrate homogeneous tracer distribution throughout the myocardium. Gated SPECT imaging reveals normal myocardial thickening and wall motion. The left ventricular ejection fraction was normal (69%).   4. This is a low risk study.  Review of Systems  Constitution: Negative for decreased appetite, malaise/fatigue, weight gain and weight loss.  HENT: Negative for congestion.   Eyes: Negative for visual disturbance.  Cardiovascular: Negative for chest pain, claudication, dyspnea on exertion, leg swelling, palpitations and syncope.  Respiratory: Negative for shortness of breath.   Endocrine: Negative for cold intolerance.  Hematologic/Lymphatic: Does not  bruise/bleed easily.  Skin: Negative for itching and rash.  Musculoskeletal: Negative for myalgias.  Gastrointestinal: Negative for abdominal pain, nausea and vomiting.  Genitourinary: Negative for dysuria.  Neurological: Positive for paresthesias. Negative for dizziness and weakness.  Psychiatric/Behavioral: The patient is not nervous/anxious.   All other systems reviewed and are negative.      Objective:   Vitals:   10/13/18 1028  BP: 129/72  Pulse: (!) 58  SpO2: 96%     Physical Exam  Constitutional: She is oriented to person, place, and time. She appears well-developed and well-nourished. No distress.  HENT:  Head: Normocephalic and atraumatic.  Eyes: Pupils are equal, round, and reactive to light. Conjunctivae are normal.  Neck: No JVD  present.  Cardiovascular: Normal rate, regular rhythm and intact distal pulses.  Murmur (II/VI apical holosystolic) heard. Pulmonary/Chest: Effort normal and breath sounds normal. She has no wheezes. She has no rales.  Abdominal: Soft. Bowel sounds are normal. There is no rebound.  Musculoskeletal:        General: No edema.  Lymphadenopathy:    She has no cervical adenopathy.  Neurological: She is alert and oriented to person, place, and time. No cranial nerve deficit.  Skin: Skin is warm and dry.  Psychiatric: She has a normal mood and affect.  Nursing note and vitals reviewed.       Assessment & Recommendations:   78 y/o [place] female with hypertension, type 2 DM, mod MR, TR, here for 1 year follow up.   1. Essential hypertension Controlled. Continue current antihypertensive therapy  2. Nonrheumatic mitral and tricuspid valve regurgitation Moderate, asymptomatic.   3. Controlled type 2 diabetes mellitus without complication, without long-term current use of insulin (HCC) Follow up with PCP.  Given bleeding risk in patient without CAD, I have stopped her Aspirin 325 mg  I will see her back in 1 year.   Elder Negus, MD [facility] [place] Pager: [phone] Office: [phone] If no answer Cell [phone]

## 2018-10-14 ENCOUNTER — Encounter: Payer: Self-pay | Admitting: Cardiology

## 2018-10-14 DIAGNOSIS — E119 Type 2 diabetes mellitus without complications: Secondary | ICD-10-CM | POA: Insufficient documentation

## 2018-10-14 DIAGNOSIS — I361 Nonrheumatic tricuspid (valve) insufficiency: Secondary | ICD-10-CM | POA: Insufficient documentation

## 2019-10-18 ENCOUNTER — Ambulatory Visit: Payer: Medicare Other | Admitting: Cardiology

## 2019-10-18 ENCOUNTER — Other Ambulatory Visit: Payer: Self-pay

## 2019-10-18 ENCOUNTER — Encounter: Payer: Self-pay | Admitting: Cardiology

## 2019-10-18 VITALS — BP 158/87 | HR 85 | Temp 98.3°F | Ht 62.0 in | Wt 123.0 lb

## 2019-10-18 DIAGNOSIS — E119 Type 2 diabetes mellitus without complications: Secondary | ICD-10-CM

## 2019-10-18 DIAGNOSIS — I10 Essential (primary) hypertension: Secondary | ICD-10-CM

## 2019-10-18 DIAGNOSIS — I340 Nonrheumatic mitral (valve) insufficiency: Secondary | ICD-10-CM

## 2019-10-18 DIAGNOSIS — I361 Nonrheumatic tricuspid (valve) insufficiency: Secondary | ICD-10-CM

## 2019-10-18 MED ORDER — AMLODIPINE BESYLATE 5 MG PO TABS: 5.0000 mg | ORAL_TABLET | Freq: Every day | ORAL | 2 refills | Status: DC

## 2019-10-18 NOTE — Progress Notes (Signed)
Patient is here for follow up visit.  Subjective:   @Patient  ID: Alexandra Bean, female    DOB: May 18, 1941, 79 y.o.   MRN: [id number]   Chief Complaint  Patient presents with  . Hypertension  . Follow-up    1 year    HPI   79 y/o Alexandra Bean female with hypertension, type 2 DM, mod MR, TR, here for 1 year follow up.   Patient is here with her own translator. She is doing well and denies chest pain, shortness of breath, palpitations, leg edema, orthopnea, PND, TIA/syncope. She is not on any antihypertensives today. BP is elevated. She has had no recent labs.   Current Outpatient Medications on File Prior to Visit  Medication Sig Dispense Refill  . calcium carbonate (OS-CAL - DOSED IN MG OF ELEMENTAL CALCIUM) 1250 MG tablet Take 1 tablet by mouth daily.    . diphenhydrAMINE (BENADRYL) 25 mg capsule Take 1 capsule (25 mg total) by mouth every 6 (six) hours as needed for itching, allergies or sleep. 30 capsule   . HYDROcodone-acetaminophen (NORCO) 7.5-325 MG per tablet Take 1-2 tablets by mouth every 4 (four) hours as needed for pain. 120 tablet 0  . hydrOXYzine (ATARAX/VISTARIL) 25 MG tablet Take 1 tablet by mouth 2 (two) times daily.    . metFORMIN (GLUCOPHAGE) 500 MG tablet Take 500 mg by mouth 2 (two) times daily with a meal.    . Naphazoline HCl (CLEAR EYES OP) Place 1 drop into both eyes daily as needed. For dry eyes     No current facility-administered medications on file prior to visit.    Cardiovascular studies:  EKG 10/18/2019: Sinus rhythm 77 bpm. Incomplete RBBB Left anterior fascicular block.  Echocardiogram 10/04/2018: Left ventricle cavity is normal in size. Moderate concentric hypertrophy of the left ventricle. Normal global wall motion. Calculated EF 55%. Left atrial cavity is mildly dilated. Moderate (Grade II) mitral regurgitation. Moderate tricuspid regurgitation. Estimated pulmonary artery systolic pressure 28  mmHg. No significant change compared to  previous study in 09/2017.  Lexiscan myoview stress test 10/04/2017:  1. Pharmacologic stress testing was performed with intravenous administration of .4 mg of Lexiscan over a 10-15 seconds infusion. Stress symptoms included dyspnea, dizziness. 2. Exercise capacity not assessed. Stress EKG is non diagnostic for ischemia as it is a pharmacologic stress.  3. The overall quality of the study is excellent. There is no evidence of abnormal lung activity. Stress and rest SPECT images demonstrate homogeneous tracer distribution throughout the myocardium. Gated SPECT imaging reveals normal myocardial thickening and wall motion. The left ventricular ejection fraction was normal (69%).   4. This is a low risk study.  Review of Systems  Cardiovascular: Negative for chest pain, dyspnea on exertion, leg swelling, palpitations and syncope.       Objective:    Vitals:   10/18/19 0939  BP: (!) 165/84  Pulse: 82  Temp: 98.3 F (36.8 C)  SpO2: 97%     Physical Exam  Constitutional: She appears well-developed and well-nourished.  Neck: No JVD present.  Cardiovascular: Normal rate, regular rhythm, normal heart sounds and intact distal pulses.  No murmur heard. Pulmonary/Chest: Effort normal and breath sounds normal. She has no wheezes. She has no rales.  Musculoskeletal:        General: No edema.  Nursing note and vitals reviewed.       Assessment & Recommendations:   79 y/o Alexandra Bean female with hypertension, type 2 DM, mod MR, TR, here for 1  year follow up.   1. Essential hypertension Uncontrolled. Started amlodipine 5 mg daily. Needs f/u wPCP in 4 weeks.  2. Nonrheumatic mitral and tricuspid valve regurgitation Moderate, asymptomatic.   3. Controlled type 2 diabetes mellitus without complication, without long-term current use of insulin (HCC) Follow up with PCP. On metformin.  I will see her back in 1 year.   Elder Negus, MD [facility] [place] Pager:  [phone] Office: [phone] If no answer Cell [phone]

## 2019-10-22 ENCOUNTER — Ambulatory Visit: Payer: Medicare Other | Attending: Internal Medicine

## 2019-10-22 DIAGNOSIS — Z23 Encounter for immunization: Secondary | ICD-10-CM | POA: Insufficient documentation

## 2019-10-22 NOTE — Progress Notes (Signed)
   Covid-19 Vaccination Clinic  Name:  Alexandra Bean    MRN: [id number] DOB: 1941/05/31  10/22/2019  Alexandra Bean was observed post Covid-19 immunization for 15 minutes without incidence. She was provided with Vaccine Information Sheet and instruction to access the V-Safe system.   Alexandra Bean was instructed to call 911 with any severe reactions post vaccine: Marland Kitchen Difficulty breathing  . Swelling of your face and throat  . A fast heartbeat  . A bad rash all over your body  . Dizziness and weakness    Immunizations Administered    Name Date Dose VIS Date Route   Pfizer COVID-19 Vaccine 10/22/2019  2:33 PM 0.3 mL 08/04/2019 Intramuscular   Manufacturer: [employer], [employer]   Lot: [id number]   NDC: [address]-7

## 2019-11-21 ENCOUNTER — Ambulatory Visit: Payer: Medicare Other | Attending: Internal Medicine

## 2019-11-21 DIAGNOSIS — Z23 Encounter for immunization: Secondary | ICD-10-CM

## 2019-11-21 NOTE — Progress Notes (Signed)
   Covid-19 Vaccination Clinic  Name:  Alexandra Bean    MRN: [id number] DOB: February 26, 1941  11/21/2019  [name] was observed post Covid-19 immunization for 15 minutes without incident. She was provided with Vaccine Information Sheet and instruction to access the V-Safe system.   [name] was instructed to call 911 with any severe reactions post vaccine: Marland Kitchen Difficulty breathing  . Swelling of face and throat  . A fast heartbeat  . A bad rash all over body  . Dizziness and weakness   Immunizations Administered    Name Date Dose VIS Date Route   Pfizer COVID-19 Vaccine 11/21/2019  2:05 PM 0.3 mL 08/04/2019 Intramuscular   Manufacturer: [employer], [employer]   Lot: [id number]   NDC: [address]-5

## 2020-02-01 ENCOUNTER — Other Ambulatory Visit: Payer: Self-pay | Admitting: Cardiology

## 2020-02-01 DIAGNOSIS — I10 Essential (primary) hypertension: Secondary | ICD-10-CM

## 2020-10-01 ENCOUNTER — Encounter: Payer: Self-pay | Admitting: Cardiology

## 2020-10-02 ENCOUNTER — Encounter: Payer: Self-pay | Admitting: Cardiology

## 2020-10-17 ENCOUNTER — Ambulatory Visit: Payer: Medicare Other | Admitting: Cardiology

## 2020-11-05 ENCOUNTER — Encounter: Payer: Self-pay | Admitting: Cardiology

## 2020-11-05 ENCOUNTER — Other Ambulatory Visit: Payer: Self-pay

## 2020-11-05 ENCOUNTER — Ambulatory Visit: Payer: Medicare Other | Admitting: Cardiology

## 2020-11-05 VITALS — BP 146/79 | HR 82 | Temp 98.7°F | Resp 17 | Ht 62.0 in | Wt 126.0 lb

## 2020-11-05 DIAGNOSIS — I10 Essential (primary) hypertension: Secondary | ICD-10-CM

## 2020-11-05 NOTE — Progress Notes (Signed)
Patient is here for follow up visit.  Subjective:   @Patient  ID: Alexandra Bean, female    DOB: [birth year], 80 y.o.   MRN: [id number]   Chief Complaint  Patient presents with  . Follow-up    1 year  . Hypertension    HPI   80 y/o 89 female with hypertension, type 2 DM, mod MR, TR, here for 1 year follow up.   Patient is here with her own translator. She is doing well and denies chest pain, shortness of breath, palpitations, leg edema, orthopnea, PND, TIA/syncope. BP is slightly elevated today. She does not check it at home.   Current Outpatient Medications on File Prior to Visit  Medication Sig Dispense Refill  . amLODipine (NORVASC) 5 MG tablet TAKE 1 TABLET(5 MG) BY MOUTH DAILY 90 tablet 3  . atorvastatin (LIPITOR) 20 MG tablet Take 20 mg by mouth daily.    . diphenhydrAMINE (BENADRYL) 25 mg capsule Take 1 capsule (25 mg total) by mouth every 6 (six) hours as needed for itching, allergies or sleep. 30 capsule   . hydrOXYzine (ATARAX/VISTARIL) 25 MG tablet Take 1 tablet by mouth 2 (two) times daily.    . metFORMIN (GLUCOPHAGE) 500 MG tablet Take 500 mg by mouth 2 (two) times daily with a meal.    . Naphazoline HCl (CLEAR EYES OP) Place 1 drop into both eyes daily as needed. For dry eyes    . triamcinolone ointment (KENALOG) 0.1 % Apply 1 application topically as needed.     No current facility-administered medications on file prior to visit.    Cardiovascular studies:  EKG 11/05/2020: Sinus rhythm 75 bpm Left axis deviation Incomplete RBBB  Echocardiogram 10/04/2018: Left ventricle cavity is normal in size. Moderate concentric hypertrophy of the left ventricle. Normal global wall motion. Calculated EF 55%. Left atrial cavity is mildly dilated. Moderate (Grade II) mitral regurgitation. Moderate tricuspid regurgitation. Estimated pulmonary artery systolic pressure 28  mmHg. No significant change compared to previous study in 09/2017.  Lexiscan myoview stress test  10/04/2017:  1. Pharmacologic stress testing was performed with intravenous administration of .4 mg of Lexiscan over a 10-15 seconds infusion. Stress symptoms included dyspnea, dizziness. 2. Exercise capacity not assessed. Stress EKG is non diagnostic for ischemia as it is a pharmacologic stress.  3. The overall quality of the study is excellent. There is no evidence of abnormal lung activity. Stress and rest SPECT images demonstrate homogeneous tracer distribution throughout the myocardium. Gated SPECT imaging reveals normal myocardial thickening and wall motion. The left ventricular ejection fraction was normal (69%).   4. This is a low risk study.  Review of Systems  Cardiovascular: Negative for chest pain, dyspnea on exertion, leg swelling, palpitations and syncope.  Musculoskeletal: Positive for joint pain.       Objective:    Vitals:   11/05/20 1223 11/05/20 1235  BP: (!) 166/85 (!) 146/79  Pulse: 79 82  Resp: 17   Temp: 98.7 F (37.1 C)   SpO2: 96% 96%     Physical Exam Vitals and nursing note reviewed.  Constitutional:      Appearance: She is well-developed.  Neck:     Vascular: No JVD.  Cardiovascular:     Rate and Rhythm: Normal rate and regular rhythm.     Pulses: Intact distal pulses.     Heart sounds: Normal heart sounds. No murmur heard.   Pulmonary:     Effort: Pulmonary effort is normal.     Breath  sounds: Normal breath sounds. No wheezing or rales.  Musculoskeletal:     Right lower leg: No edema.     Left lower leg: No edema.         Assessment & Recommendations:   80 y/o [place] female with hypertension, type 2 DM, mod MR, TR, here for 1 year follow up.   1. Essential hypertension: Suboptimal control.  Arranged for remote patient monitoring through pur pharmacist Cassell Clement. No change made today.  2. Nonrheumatic mitral and tricuspid valve regurgitation Moderate, asymptomatic.   3. Controlled type 2 diabetes mellitus without  complication, without long-term current use of insulin (HCC) Follow up with PCP. On metformin.  F/u in 3 months  Manish Emiliano Dyer, MD [facility] Pager: [phone] Office: [phone] If no answer Cell [phone]

## 2021-01-17 ENCOUNTER — Encounter (HOSPITAL_COMMUNITY): Payer: Self-pay | Admitting: Emergency Medicine

## 2021-01-17 ENCOUNTER — Other Ambulatory Visit: Payer: Self-pay

## 2021-01-17 ENCOUNTER — Ambulatory Visit (HOSPITAL_COMMUNITY)
Admission: EM | Admit: 2021-01-17 | Discharge: 2021-01-17 | Disposition: A | Discharge status: Home / Self Care | Payer: Medicare Other | Attending: Family Medicine | Admitting: Family Medicine

## 2021-01-17 DIAGNOSIS — R21 Rash and other nonspecific skin eruption: Secondary | ICD-10-CM

## 2021-01-17 DIAGNOSIS — L03811 Cellulitis of head [any part, except face]: Secondary | ICD-10-CM

## 2021-01-17 MED ORDER — CEPHALEXIN 500 MG PO CAPS: 500.0000 mg | ORAL_CAPSULE | Freq: Two times a day (BID) | ORAL | 0 refills | Status: DC

## 2021-01-17 MED ORDER — HIBICLENS 4 % EX LIQD: Freq: Every day | CUTANEOUS | 0 refills | Status: DC | PRN

## 2021-01-17 NOTE — ED Triage Notes (Signed)
Pt presents with head pain on left side xs 3 days. States has red circle on back of head that is painful.

## 2021-01-17 NOTE — ED Provider Notes (Signed)
[facility]    CSN: [id number] Arrival date & time: 01/17/21  1731      History   Chief Complaint Chief Complaint  Patient presents with  . Headache    HPI Alexandra Bean is a 80 y.o. female.   Medical interpreter utilized today to facilitate visit with patient's consent.  She states she has a swollen spot on the left posterior scalp that she has noticed the past 3 days.  She notes it is very mildly painful with direct pressure but otherwise does not bother her.  Has not fallen or hit her head on anything, no other injury to the area that she is aware of, does not recall getting bit or stung in the area or using new hair products.  Denies fever, chills, drainage from the area, dizziness, vision changes, mental status changes.  No other concerning symptoms at this time.  Has not tried anything over-the-counter for symptoms.     Past Medical History:  Diagnosis Date  . Arthritis   . Diabetes mellitus without complication (HCC)   . Hyperlipidemia   . Hypertension   . Murmur 10/13/2018    Patient Active Problem List   Diagnosis Date Noted  . Nonrheumatic tricuspid valve regurgitation 10/14/2018  . Controlled type 2 diabetes mellitus (HCC) 10/14/2018  . Murmur 10/13/2018  . Essential hypertension 10/11/2018  . Nonrheumatic mitral valve regurgitation 10/11/2018  . Hyperlipidemia 10/11/2018  . Chest pain 10/11/2018  . Hyponatremia 06/22/2012  . Expected blood loss anemia 06/21/2012  . Overweight (BMI 25.0-29.9) 06/21/2012  . 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 ABDOMINAL HYSTERECTOMY     pt. 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  . TOTAL KNEE ARTHROPLASTY  06/20/2012   Procedure: TOTAL KNEE ARTHROPLASTY;  Surgeon: Shelda Pal, MD;  Location: [facility];  Service: Orthopedics;  Laterality: Right;    OB History   No obstetric history on file.      Home Medications    Prior to Admission  medications   Medication Sig Start Date End Date Taking? Authorizing Provider  cephALEXin (KEFLEX) 500 MG capsule Take 1 capsule (500 mg total) by mouth 2 (two) times daily. 01/17/21  Yes Particia Nearing, PA-C  chlorhexidine (HIBICLENS) 4 % external liquid Apply topically daily as needed. 01/17/21  Yes Particia Nearing, PA-C  amLODipine (NORVASC) 5 MG tablet TAKE 1 TABLET(5 MG) BY MOUTH DAILY 02/02/20   Patwardhan, Manish J, MD  atorvastatin (LIPITOR) 20 MG tablet Take 20 mg by mouth daily. 10/10/20   [provider]  diphenhydrAMINE (BENADRYL) 25 mg capsule Take 1 capsule (25 mg total) by mouth every 6 (six) hours as needed for itching, allergies or sleep. 06/22/12   Lanney Gins, PA-C  hydrOXYzine (ATARAX/VISTARIL) 25 MG tablet Take 1 tablet by mouth 2 (two) times daily. 07/25/19   [provider]  metFORMIN (GLUCOPHAGE) 500 MG tablet Take 500 mg by mouth 2 (two) times daily with a meal.    [provider]  Naphazoline HCl (CLEAR EYES OP) Place 1 drop into both eyes daily as needed. For dry eyes    [provider]  triamcinolone ointment (KENALOG) 0.1 % Apply 1 application topically as needed. 11/28/19   [provider]    Family History History reviewed. No pertinent family history.  Social History Social History   Tobacco Use  . Smoking status: Never Smoker  . Smokeless tobacco:  Never Used  Vaping Use  . Vaping Use: Never used  Substance Use Topics  . Alcohol use: No  . Drug use: No     Allergies   Patient has no known allergies.   Review of Systems Review of Systems Per HPI Physical Exam Triage Vital Signs ED Triage Vitals [01/17/21 1844]  Enc Vitals Group     BP (!) 164/77     Pulse Rate (!) 104     Resp 18     Temp 99 F (37.2 C)     Temp Source Oral     SpO2 100 %     Weight      Height      Head Circumference      Peak Flow      Pain Score      Pain Loc      Pain Edu?      Excl. in GC?    No data  found.  Updated Vital Signs BP (!) 160/84   Pulse (!) 101   Temp 99 F (37.2 C) (Oral)   Resp 18   SpO2 100%   Visual Acuity Right Eye Distance:   Left Eye Distance:   Bilateral Distance:    Right Eye Near:   Left Eye Near:    Bilateral Near:     Physical Exam Vitals and nursing note reviewed.  Constitutional:      General: She is not in acute distress.    Appearance: Normal appearance. She is well-developed. She is not ill-appearing.  HENT:     Head: Atraumatic.  Eyes:     Extraocular Movements: Extraocular movements intact.     Conjunctiva/sclera: Conjunctivae normal.  Cardiovascular:     Rate and Rhythm: Normal rate and regular rhythm.     Heart sounds: Normal heart sounds.  Pulmonary:     Effort: Pulmonary effort is normal.     Breath sounds: Normal breath sounds.  Musculoskeletal:        General: Normal range of motion.     Cervical back: Normal range of motion and neck supple.  Skin:    General: Skin is warm and dry.     Comments: Erythematous circular region left posterior scalp, mildly edematous, central scabbing and crusting.  No active drainage or fluctuance, induration  Neurological:     General: No focal deficit present.     Mental Status: She is alert and oriented to person, place, and time.     Cranial Nerves: No cranial nerve deficit.     Motor: No weakness.     Gait: Gait normal.  Psychiatric:        Mood and Affect: Mood normal.        Thought Content: Thought content normal.        Judgment: Judgment normal.      UC Treatments / Results  Labs (all labs ordered are listed, but only abnormal results are displayed) Labs Reviewed - No data to display  EKG  Radiology No results found.  Procedures Procedures (including critical care time)  Medications Ordered in UC Medications - No data to display  Initial Impression / Assessment and Plan / UC Course  I have reviewed the triage vital signs and the nursing notes.  Pertinent labs &  imaging results that were available during [name] care of the patient were reviewed by me and considered in [facility] medical decision making (see chart for details).     Possibly cellulitis, will trial Keflex, Hibiclens, warm  compresses and monitor closely for benefit.  Follow-up closely with PCP next week for recheck.  ED if acutely worsening in the meantime.  Patient expresses understanding.  Final Clinical Impressions(s) / UC Diagnoses   Final diagnoses:  Cellulitis of head except face   Discharge Instructions   None    ED Prescriptions    Medication Sig Dispense Auth. Provider   cephALEXin (KEFLEX) 500 MG capsule Take 1 capsule (500 mg total) by mouth 2 (two) times daily. 14 capsule Particia Nearing, [place]   chlorhexidine (HIBICLENS) 4 % external liquid Apply topically daily as needed. 120 mL Particia Nearing, [place]     PDMP not reviewed this encounter.   Particia Nearing, [place] 01/17/21 2059

## 2021-02-19 ENCOUNTER — Other Ambulatory Visit: Payer: Self-pay | Admitting: Cardiology

## 2021-02-19 DIAGNOSIS — I10 Essential (primary) hypertension: Secondary | ICD-10-CM

## 2021-02-26 ENCOUNTER — Other Ambulatory Visit: Payer: Self-pay

## 2021-02-26 ENCOUNTER — Encounter: Payer: Self-pay | Admitting: Cardiology

## 2021-02-26 ENCOUNTER — Ambulatory Visit: Payer: Medicare Other | Admitting: Cardiology

## 2021-02-26 VITALS — BP 145/78 | HR 82 | Temp 98.3°F | Resp 17 | Ht 62.0 in | Wt 123.0 lb

## 2021-02-26 DIAGNOSIS — I10 Essential (primary) hypertension: Secondary | ICD-10-CM

## 2021-02-26 DIAGNOSIS — E782 Mixed hyperlipidemia: Secondary | ICD-10-CM

## 2021-02-26 MED ORDER — AMLODIPINE BESYLATE 5 MG PO TABS: 5.0000 mg | ORAL_TABLET | Freq: Every day | ORAL | 3 refills | Status: DC

## 2021-02-26 MED ORDER — ATORVASTATIN CALCIUM 20 MG PO TABS: 20.0000 mg | ORAL_TABLET | Freq: Every day | ORAL | 3 refills | Status: AC

## 2021-02-26 NOTE — Progress Notes (Signed)
Patient is here for follow up visit.  Subjective:   @Patient  ID: Alexandra Bean, female    DOB: 09/22/1940, 80 y.o.   MRN: [id number]   Chief Complaint  Patient presents with   Follow-up    3 month   Hypertension    HPI   80 y/o 89 female with hypertension, type 2 DM, mod MR, TR, here for 1 year follow up.   Patient is here with her own translator. Home BP readings reviewed, avg 126/76 mmHg, HR 80 bpm. She denies chest pain, shortness of breath, palpitations, leg edema, orthopnea, PND, TIA/syncope. She has had upper back and neck pain for several days, constant.  Current Outpatient Medications on File Prior to Visit  Medication Sig Dispense Refill   amLODipine (NORVASC) 5 MG tablet TAKE 1 TABLET(5 MG) BY MOUTH DAILY 90 tablet 3   atorvastatin (LIPITOR) 20 MG tablet Take 20 mg by mouth daily.     cephALEXin (KEFLEX) 500 MG capsule Take 1 capsule (500 mg total) by mouth 2 (two) times daily. 14 capsule 0   chlorhexidine (HIBICLENS) 4 % external liquid Apply topically daily as needed. 120 mL 0   diphenhydrAMINE (BENADRYL) 25 mg capsule Take 1 capsule (25 mg total) by mouth every 6 (six) hours as needed for itching, allergies or sleep. 30 capsule    hydrOXYzine (ATARAX/VISTARIL) 25 MG tablet Take 1 tablet by mouth 2 (two) times daily.     metFORMIN (GLUCOPHAGE) 500 MG tablet Take 500 mg by mouth 2 (two) times daily with a meal.     Naphazoline HCl (CLEAR EYES OP) Place 1 drop into both eyes daily as needed. For dry eyes     triamcinolone ointment (KENALOG) 0.1 % Apply 1 application topically as needed.     No current facility-administered medications on file prior to visit.    Cardiovascular studies:  EKG 11/05/2020: Sinus rhythm 75 bpm Left axis deviation Incomplete RBBB  Echocardiogram 10/04/2018: Left ventricle cavity is normal in size. Moderate concentric hypertrophy of the left ventricle. Normal global wall motion. Calculated EF 55%. Left atrial cavity is mildly  dilated. Moderate (Grade II) mitral regurgitation. Moderate tricuspid regurgitation. Estimated pulmonary artery systolic pressure 28  mmHg. No significant change compared to previous study in 09/2017.  Lexiscan myoview stress test 10/04/2017:  1. Pharmacologic stress testing was performed with intravenous administration of .4 mg of Lexiscan over a 10-15 seconds infusion. Stress symptoms included dyspnea, dizziness. 2. Exercise capacity not assessed. Stress EKG is non diagnostic for ischemia as it is a pharmacologic stress.  3. The overall quality of the study is excellent. There is no evidence of abnormal lung activity. Stress and rest SPECT images demonstrate homogeneous tracer distribution throughout the myocardium. Gated SPECT imaging reveals normal myocardial thickening and wall motion. The left ventricular ejection fraction was normal (69%).   4. This is a low risk study.  Review of Systems  Cardiovascular:  Negative for chest pain, dyspnea on exertion, leg swelling, palpitations and syncope.  Musculoskeletal:  Positive for back pain and neck pain. Negative for joint pain.      Objective:    Vitals:   02/26/21 0945  BP: (!) 145/78  Pulse: 82  Resp: 17  Temp: 98.3 F (36.8 C)  SpO2: 97%     Physical Exam Vitals and nursing note reviewed.  Constitutional:      General: She is not in acute distress. Neck:     Vascular: No JVD.  Cardiovascular:  Rate and Rhythm: Normal rate and regular rhythm.     Heart sounds: Normal heart sounds. No murmur heard. Pulmonary:     Effort: Pulmonary effort is normal.     Breath sounds: Normal breath sounds. No wheezing or rales.  Musculoskeletal:     Right lower leg: No edema.     Left lower leg: No edema.        Assessment & Recommendations:   80 y/o [place] female with hypertension, type 2 DM, mod MR, TR, here for 1 year follow up.   Primary hypertension: Well controlled.  Elevated blood pressure readings in the office  likely due to whitecoat hypertension. Refilled amlodipine 5 mg.  Mixed hyperlipidemia: I do not have recent lipid panes.  Last checked by PCP in 02/2020, will obtain results. Refill atorvastatin.  Nonrheumatic mitral and tricuspid valve regurgitation Moderate, asymptomatic.   Back/neck pain: Likely musculoskeletal.  Follow-up with PCP  F/u in 1 year  Elder Negus, MD [facility]. [place] Pager: [phone] Office: [phone] If no answer Cell [phone]

## 2021-06-23 ENCOUNTER — Encounter: Payer: Self-pay | Admitting: Pharmacist

## 2021-06-23 NOTE — Progress Notes (Signed)
CARE PLAN ENTRY  06/23/2021 Name: Alexandra Bean MRN: [id number] DOB: 08-01-1941  Alexandra Bean is enrolled in Remote Patient Monitoring/Principle Care Monitoring.  Date of Enrollment: 11/05/20 Supervising physician: Alexandra Bean Indication: HTN  Remote Readings: Compliant and Avg BP: 133/81, HR:80  Next scheduled OV: Not Scheduled (~02/26/22)  Pharmacist Clinical Goal(s):  Over the next 90 days, patient will demonstrate Improved medication adherence as evidenced by medication fill history Over the next 90 days, patient will demonstrate improved understanding of prescribed medications and rationale for usage as evidenced by patient teach back Over the next 90 days, patient will experience decrease in ED visits. ED visits in last 6 months = 0 Over the next 90 days, patient will not experience hospital admission. Hospital Admissions in last 6 months = 0  Interventions: Provider and Inter-disciplinary care team collaboration (see longitudinal plan of care) Comprehensive medication review performed. Discussed plans with patient for ongoing care management follow up and provided patient with direct contact information for care management team Collaboration with provider re: medication management  Patient Self Care Activities:  Self administers medications as prescribed Attends all scheduled provider appointments Performs ADL's independently Performs IADL's independently  No Known Allergies Outpatient Encounter Medications as of 06/23/2021  Medication Sig   alendronate (FOSAMAX) 70 MG tablet Take 70 mg by mouth once a week.   amLODipine (NORVASC) 5 MG tablet Take 1 tablet (5 mg total) by mouth daily.   atorvastatin (LIPITOR) 20 MG tablet Take 1 tablet (20 mg total) by mouth daily.   chlorhexidine (HIBICLENS) 4 % external liquid Apply topically daily as needed.   diphenhydrAMINE (BENADRYL) 25 mg capsule Take 1 capsule (25 mg total) by mouth every 6 (six) hours as needed for itching,  allergies or sleep.   hydrOXYzine (ATARAX/VISTARIL) 25 MG tablet Take 2 tablets by mouth 2 (two) times daily.   metFORMIN (GLUCOPHAGE) 500 MG tablet Take 500 mg by mouth 2 (two) times daily with a meal.   Naphazoline HCl (CLEAR EYES OP) Place 1 drop into both eyes daily as needed. For dry eyes   triamcinolone ointment (KENALOG) 0.1 % Apply 1 application topically as needed.   No facility-administered encounter medications on file as of 06/23/2021.    Hypertension   BP goal is:  <130/80  Office blood pressures are  BP Readings from Last 3 Encounters:  02/26/21 (!) 145/78  01/17/21 (!) 160/84  11/05/20 (!) 146/79    Patient is currently controlled on the following medications: Amlodipine 5 mg  Patient checks BP at home daily  Patient home BP readings are ranging: 115-161/70-104  Patient has tried  these meds in the past: Olmesartan-Amlodipine-HCTZ  We discussed diet and exercise extensively  Plan  Continue current medications and control with diet and exercise   BP continues to remain stable and near goal on amlodipine. BP trending up slightly over the past week. Previously stable. Will continue monitoring and consider increasing amlodipine dose if BP continues to remain elevated above goal.   ______________ Visit Information SDOH (Social Determinants of Health) assessments performed: Yes.  [name] was given information about Principle Care Management/Remote Patient Monitoring services today including:  RPM/PCM service includes personalized support from designated clinical staff supervised by her physician, including individualized plan of care and coordination with other care providers 24/7 contact phone numbers for assistance for urgent and routine care needs. Standard insurance, coinsurance, copays and deductibles apply for principle care management only during months in which we provide at least 30 minutes  of these services. Most insurances cover these services at 100%,  however patients may be responsible for any copay, coinsurance and/or deductible if applicable. This service may help you avoid the need for more expensive face-to-face services. Only one practitioner may furnish and bill the service in a calendar month. The patient may stop PCM/RPM services at any time (effective at the end of the month) by phone call to the office staff.  Patient agreed to services and verbal consent obtained.   Cassell Clement, Pharm.D. Clinical Pharmacist [facility] [phone] Ext: [phone]

## 2021-12-31 NOTE — Progress Notes (Signed)
? ? ?Patient is here for follow up visit. ? ?Subjective:  ? ?@Patient  ID: Alexandra Bean, female    DOB: 1941-01-28, 81 y.o.   MRN: [id number] ? ? ?No chief complaint on file. ? ? ?HPI  ? ?81 y.o. [place] female with hypertension, type 2 DM, mod MR, TR.  ? ?Patient presents for follow-up of hypertension.  At last office visit patient was stable from a cardiovascular standpoint, therefore no changes were made.  Patient is enrolled in remote patient monitoring.  Her blood pressure was previously well controlled on amlodipine 5 mg p.o. daily.  However 1 week ago blood pressure started to rise and was uncontrolled over the last 1 week.  Patient states that in that same timeframe she started some herbal supplements, which she subsequently discontinued 2 days ago. She is otherwise asymptomatic, denies chest pain, shortness of breath, palpitations. ? ?Current Outpatient Medications on File Prior to Visit  ?Medication Sig Dispense Refill  ? alendronate (FOSAMAX) 70 MG tablet Take 70 mg by mouth once a week.    ? atorvastatin (LIPITOR) 20 MG tablet Take 1 tablet (20 mg total) by mouth daily. 90 tablet 3  ? metFORMIN (GLUCOPHAGE) 500 MG tablet Take 500 mg by mouth 2 (two) times daily with a meal.    ? Naphazoline HCl (CLEAR EYES OP) Place 1 drop into both eyes daily as needed. For dry eyes    ? triamcinolone ointment (KENALOG) 0.1 % Apply 1 application topically as needed.    ? ?No current facility-administered medications on file prior to visit.  ? ? ?Cardiovascular studies: ?EKG 01/01/2022:  ?Normal sinus rhythm at a rate of 60 bpm.  Left axis, left anterior fascicular block.  Incomplete right bundle branch block.  No evidence of ischemia or underlying injury pattern. Early repolarization.  ? ?EKG 11/05/2020: ?Sinus rhythm 75 bpm ?Left axis deviation ?Incomplete RBBB ? ?Echocardiogram 10/04/2018: ?Left ventricle cavity is normal in size. Moderate concentric hypertrophy of the left ventricle. Normal global wall motion.  Calculated EF 55%. ?Left atrial cavity is mildly dilated. ?Moderate (Grade II) mitral regurgitation. ?Moderate tricuspid regurgitation. Estimated pulmonary artery systolic pressure 28  mmHg. ?No significant change compared to previous study in 09/2017. ? ?Lexiscan myoview stress test 10/04/2017:  ?1. Pharmacologic stress testing was performed with intravenous administration of .4 mg of Lexiscan over a 10-15 seconds infusion. Stress symptoms included dyspnea, dizziness. ?2. Exercise capacity not assessed. Stress EKG is non diagnostic for ischemia as it is a pharmacologic stress.  ?3. The overall quality of the study is excellent. There is no evidence of abnormal lung activity. Stress and rest SPECT images demonstrate homogeneous tracer distribution throughout the myocardium. Gated SPECT imaging reveals normal myocardial thickening and wall motion. The left ventricular ejection fraction was normal (69%).   ?4. This is a low risk study. ? ?Review of Systems  ?Cardiovascular:  Negative for chest pain, dyspnea on exertion, leg swelling, palpitations and syncope.  ?Musculoskeletal:  Positive for back pain and neck pain. Negative for joint pain.  ? ?   ?Objective:  ? ? ?Vitals:  ? 01/01/22 0908  ?BP: (!) 174/94  ?Pulse: 71  ?Resp: 16  ?Temp: 97.9 ?F (36.6 ?C)  ?SpO2: 95%  ? ? ? Physical Exam ?Vitals and nursing note reviewed.  ?Constitutional:   ?   General: She is not in acute distress. ?Neck:  ?   Vascular: No JVD.  ?Cardiovascular:  ?   Rate and Rhythm: Normal rate and regular rhythm.  ?  Heart sounds: Normal heart sounds. No murmur heard. ?Pulmonary:  ?   Effort: Pulmonary effort is normal.  ?   Breath sounds: Normal breath sounds. No wheezing or rales.  ?Musculoskeletal:  ?   Right lower leg: No edema.  ?   Left lower leg: No edema.  ?Neurological:  ?   General: No focal deficit present.  ?   Mental Status: She is oriented to person, place, and time.  ?   Cranial Nerves: No cranial nerve deficit.  ? ? ? ?    ?Assessment & Recommendations:  ? ?81 y.o. [place] female with hypertension, type 2 DM, mod MR, TR.  ? ?Primary hypertension: ?Uncontrolled over the last week likely due to herbal supplements she recently started taking.  Blood pressure was previously well controlled on amlodipine 5 mg daily.  Been elevated blood pressure Will increase amlodipine from 5 mg to 10 mg p.o. daily.  Patient is advised to discontinue herbal supplements. ?Enrolled in remote patient monitoring, will continue to follow closely. ? ?Mixed hyperlipidemia: ?Continue statin therapy ? ?Nonrheumatic mitral and tricuspid valve regurgitation ?Moderate, asymptomatic.  ? ?Follow-up in 6 months, sooner if needed. ? ? ?Alethia Berthold, PA-C ?01/01/2022, 10:31 AM ?Office: [phone] ? ?

## 2022-01-01 ENCOUNTER — Encounter: Payer: Self-pay | Admitting: Student

## 2022-01-01 ENCOUNTER — Emergency Department (HOSPITAL_COMMUNITY)
Admission: EM | Admit: 2022-01-01 | Discharge: 2022-01-01 | Disposition: A | Discharge status: Home / Self Care | Payer: Medicare Other | Attending: Emergency Medicine | Admitting: Emergency Medicine

## 2022-01-01 ENCOUNTER — Other Ambulatory Visit: Payer: Self-pay

## 2022-01-01 ENCOUNTER — Ambulatory Visit: Payer: Medicare Other | Admitting: Student

## 2022-01-01 ENCOUNTER — Emergency Department (HOSPITAL_COMMUNITY): Payer: Medicare Other

## 2022-01-01 ENCOUNTER — Encounter (HOSPITAL_COMMUNITY): Payer: Self-pay

## 2022-01-01 VITALS — BP 174/94 | HR 71 | Temp 97.9°F | Resp 16 | Ht 62.0 in | Wt 124.0 lb

## 2022-01-01 DIAGNOSIS — W01198A Fall on same level from slipping, tripping and stumbling with subsequent striking against other object, initial encounter: Secondary | ICD-10-CM | POA: Diagnosis not present

## 2022-01-01 DIAGNOSIS — E119 Type 2 diabetes mellitus without complications: Secondary | ICD-10-CM | POA: Diagnosis not present

## 2022-01-01 DIAGNOSIS — S0031XA Abrasion of nose, initial encounter: Secondary | ICD-10-CM | POA: Insufficient documentation

## 2022-01-01 DIAGNOSIS — S43005A Unspecified dislocation of left shoulder joint, initial encounter: Secondary | ICD-10-CM

## 2022-01-01 DIAGNOSIS — M25512 Pain in left shoulder: Secondary | ICD-10-CM | POA: Diagnosis present

## 2022-01-01 DIAGNOSIS — R519 Headache, unspecified: Secondary | ICD-10-CM | POA: Diagnosis present

## 2022-01-01 DIAGNOSIS — I10 Essential (primary) hypertension: Secondary | ICD-10-CM | POA: Insufficient documentation

## 2022-01-01 DIAGNOSIS — Z7984 Long term (current) use of oral hypoglycemic drugs: Secondary | ICD-10-CM | POA: Diagnosis not present

## 2022-01-01 DIAGNOSIS — Y9301 Activity, walking, marching and hiking: Secondary | ICD-10-CM | POA: Insufficient documentation

## 2022-01-01 DIAGNOSIS — Z79899 Other long term (current) drug therapy: Secondary | ICD-10-CM | POA: Diagnosis not present

## 2022-01-01 DIAGNOSIS — S0993XA Unspecified injury of face, initial encounter: Secondary | ICD-10-CM | POA: Diagnosis present

## 2022-01-01 DIAGNOSIS — W19XXXA Unspecified fall, initial encounter: Secondary | ICD-10-CM

## 2022-01-01 DIAGNOSIS — S43015A Anterior dislocation of left humerus, initial encounter: Secondary | ICD-10-CM | POA: Insufficient documentation

## 2022-01-01 DIAGNOSIS — Z23 Encounter for immunization: Secondary | ICD-10-CM | POA: Insufficient documentation

## 2022-01-01 MED ORDER — FENTANYL CITRATE PF 50 MCG/ML IJ SOSY
50.0000 ug | PREFILLED_SYRINGE | Freq: Once | INTRAMUSCULAR | Status: AC
  Administered 2022-01-01: 50 ug via INTRAMUSCULAR
  Filled 2022-01-01: qty 1

## 2022-01-01 MED ORDER — OXYCODONE-ACETAMINOPHEN 5-325 MG PO TABS
1.0000 | ORAL_TABLET | Freq: Once | ORAL | Status: AC
  Administered 2022-01-01: 1 via ORAL
  Filled 2022-01-01: qty 1

## 2022-01-01 MED ORDER — TETANUS-DIPHTH-ACELL PERTUSSIS 5-2.5-18.5 LF-MCG/0.5 IM SUSY
0.5000 mL | PREFILLED_SYRINGE | Freq: Once | INTRAMUSCULAR | Status: AC
  Administered 2022-01-01: 0.5 mL via INTRAMUSCULAR
  Filled 2022-01-01: qty 0.5

## 2022-01-01 MED ORDER — AMLODIPINE BESYLATE 5 MG PO TABS: 10.0000 mg | ORAL_TABLET | Freq: Every day | ORAL | 3 refills | Status: DC

## 2022-01-01 NOTE — ED Provider Triage Note (Signed)
Emergency Medicine Provider Triage Evaluation Note ? ?Alexandra Bean , a 81 y.o. female  was evaluated in triage.  Pt complains of fall. Pt report she tripped on her flip flop, fell forward and struck face against ground PTA.  Report pain primarily to her L shoulder.  No LOC, no prodromal sxs. ? ?Review of Systems  ?Positive: As above ?Negative: As above ? ?Physical Exam  ?BP (!) 163/59 (BP Location: Right Arm)   Pulse (!) 55   Temp 98.4 ?F (36.9 ?C) (Oral)   Resp (!) 30   Ht 5\' 2"  (1.575 m)   Wt 56 kg   SpO2 97%   BMI 22.58 kg/m?  ?Gen:   Awake, no distress   ?Resp:  Normal effort  ?MSK:   Moves extremities without difficulty  ?Other:  Abrasion to nose.  Ttp L shoulder, no deformity ? ?Medical Decision Making  ?Medically screening exam initiated at 11:46 AM.  Appropriate orders placed.  Alexandra Bean was informed that the remainder of the evaluation will be completed by another provider, this initial triage assessment does not replace that evaluation, and the importance of remaining in the ED until their evaluation is complete. ? ? ?  ?Conley Rolls, PA-C ?01/01/22 1152 ? ?

## 2022-01-01 NOTE — ED Triage Notes (Addendum)
Pt to ED with caregiver for fall. Pt does not speak english, caregiver translating in triage; states she had just brought pt home from visit with cardiologist and patient tripped and fell on driveway, face down. Patient complaining of left arm and hip pain, abrasions present on nose.  ?

## 2022-01-01 NOTE — Discharge Instructions (Addendum)
Please return to the ED with any new symptoms such as dislocation of left shoulder, numbness, weakness ?Please follow-up with orthopedic doctor I referred you to.  You will need to call to make an appointment to be seen. ?Please remain in the sling until you are seen by Dr. Ginette Pitman. ?Please refer to the attached informational guide concerning shoulder dislocations for further education ?You may take ibuprofen at home for pain as well as Tylenol ?

## 2022-01-01 NOTE — ED Provider Notes (Signed)
?[facility] ?Provider Note ? ? ?CSN: [id number] ?Arrival date & time: 01/01/22  1110 ? ?  ? ?History ? ?Chief Complaint  ?Patient presents with  ? Fall  ? ? ?Alexandra Bean is a 81 y.o. female with medical history of arthritis, diabetes, hyperlipidemia, hypertension.  Patient presents with daughters who assist in translation.  A translator was also used for the duration of the interaction.  Patient states that after cardiology appointment today, she was walking back up to her house when one of her flip-flops got caught on the ground causing her to trip and fall over onto her left side.  Patient denies hitting her head, denies being on blood thinners, denies losing consciousness.  Patient reports at this time showed immediate onset left shoulder pain as well as pain in her nose because she hit her face on the cement.  Patient denies any neck pain, back pain.  Patient states that her and her daughters immediately reported to ED at this time for further management. ? ? ?Fall ? ? ?  ? ?Home Medications ?Prior to Admission medications   ?Medication Sig Start Date End Date Taking? Authorizing Provider  ?alendronate (FOSAMAX) 70 MG tablet Take 70 mg by mouth once a week. 01/08/21   [provider]  ?amLODipine (NORVASC) 5 MG tablet Take 2 tablets (10 mg total) by mouth daily. 01/01/22   Cantwell, Celeste C, PA-C  ?atorvastatin (LIPITOR) 20 MG tablet Take 1 tablet (20 mg total) by mouth daily. 02/26/21   Patwardhan, Anabel BeneManish J, MD  ?metFORMIN (GLUCOPHAGE) 500 MG tablet Take 500 mg by mouth 2 (two) times daily with a meal.    [provider]  ?Naphazoline HCl (CLEAR EYES OP) Place 1 drop into both eyes daily as needed. For dry eyes    [provider]  ?triamcinolone ointment (KENALOG) 0.1 % Apply 1 application topically as needed. 11/28/19   [provider]  ?   ? ?Allergies    ?Patient has no known allergies.   ? ?Review of Systems   ?Review of Systems   ?Musculoskeletal:  Positive for arthralgias and myalgias. Negative for back pain and neck pain.  ?Neurological:  Negative for syncope.  ?All other systems reviewed and are negative. ? ?Physical Exam ?Updated Vital Signs ?BP 133/71   Pulse 60   Temp 98.4 ?F (36.9 ?C) (Oral)   Resp 17   Ht 5\' 2"  (1.575 m)   Wt 56 kg   SpO2 98%   BMI 22.58 kg/m?  ?Physical Exam ?Vitals and nursing note reviewed.  ?Constitutional:   ?   General: She is not in acute distress. ?   Appearance: Normal appearance. She is not ill-appearing, toxic-appearing or diaphoretic.  ?HENT:  ?   Head: Normocephalic.  ?   Nose:  ?   Comments: Superficial abrasion noted to patient bridge of nose. ? ?   Mouth/Throat:  ?   Mouth: Mucous membranes are moist.  ?   Pharynx: Oropharynx is clear.  ?Eyes:  ?   Extraocular Movements: Extraocular movements intact.  ?   Conjunctiva/sclera: Conjunctivae normal.  ?   Pupils: Pupils are equal, round, and reactive to light.  ?Cardiovascular:  ?   Rate and Rhythm: Normal rate and regular rhythm.  ?Pulmonary:  ?   Effort: Pulmonary effort is normal.  ?   Breath sounds: Normal breath sounds.  ?Abdominal:  ?   General: Abdomen is flat.  ?   Palpations: Abdomen is soft.  ?  Tenderness: There is no abdominal tenderness.  ?Musculoskeletal:  ?   Right shoulder: Normal.  ?   Left shoulder: Deformity and tenderness present. Decreased range of motion.  ?   Cervical back: Normal range of motion and neck supple.  ?   Comments: Obviously deformed left shoulder.  ?Skin: ?   General: Skin is warm and dry.  ?   Capillary Refill: Capillary refill takes less than 2 seconds.  ?Neurological:  ?   General: No focal deficit present.  ?   Mental Status: She is alert and oriented to person, place, and time.  ?   GCS: GCS eye subscore is 4. GCS verbal subscore is 5. GCS motor subscore is 6.  ?   Cranial Nerves: Cranial nerves 2-12 are intact. No cranial nerve deficit.  ?   Sensory: Sensation is intact. No sensory deficit.  ?   Motor:  Motor function is intact. No weakness.  ?   Coordination: Coordination is intact. Heel to [facility] Test normal.  ?   Comments: Patient has intact sensation to left arm diffusely.  2+ radial pulse.  3/5 grip strength.  ? ? ?ED Results / Procedures / Treatments   ?Labs ?(all labs ordered are listed, but only abnormal results are displayed) ?Labs Reviewed - No data to display ? ?EKG ?None ? ?Radiology ?CT Head Wo Contrast ? ?Result Date: 01/01/2022 ?CLINICAL DATA:  Fall EXAM: CT HEAD WITHOUT CONTRAST CT MAXILLOFACIAL WITHOUT CONTRAST CT CERVICAL SPINE WITHOUT CONTRAST TECHNIQUE: Multidetector CT imaging of the head, cervical spine, and maxillofacial structures were performed using the standard protocol without intravenous contrast. Multiplanar CT image reconstructions of the cervical spine and maxillofacial structures were also generated. RADIATION DOSE REDUCTION: This exam was performed according to the departmental dose-optimization program which includes automated exposure control, adjustment of the mA and/or kV according to patient size and/or use of iterative reconstruction technique. COMPARISON:  None Available. FINDINGS: CT HEAD FINDINGS Brain: No evidence of acute infarction, hemorrhage, hydrocephalus, extra-axial collection or mass lesion/mass effect. Mineralization in the bilateral basal ganglia. Periventricular white matter changes, likely the sequela of chronic small vessel ischemic disease. Vascular: No hyperdense vessel. Atherosclerotic calcifications in the intracranial carotid and vertebral arteries. Skull: Hyperostosis frontalis. Negative for fracture or focal lesion. Other: None. CT MAXILLOFACIAL FINDINGS Osseous: No fracture or mandibular dislocation. No destructive process. Orbits: Negative. No traumatic or inflammatory finding. Sinuses: Bubbly fluid in the right maxillary sinus. Otherwise clear. The mastoids are well aerated. Soft tissues: No large hematoma or laceration. CT CERVICAL SPINE FINDINGS  Alignment: Straightening and mild reversal of the normal cervical lordosis. Trace anterolisthesis of C3 on C4 and trace retrolisthesis of C5 on C6, which appears degenerative. Skull base and vertebrae: No acute fracture. No primary bone lesion or focal pathologic process. Soft tissues and spinal canal: No prevertebral fluid or swelling. No visible canal hematoma. Disc levels: Multilevel degenerative changes, with mild spinal canal stenosis and C4-C5 and C5-C6, and moderate spinal canal stenosis at C6-C7. Multilevel uncovertebral and facet arthropathy, which causes up to severe neural foraminal narrowing bilaterally at C3-C4 and on the right at C4-C5 and C5-C6. Upper chest: Focal pulmonary opacity or pleural effusion. Other: None. IMPRESSION: 1.  No acute intracranial process. 2. No acute facial bone fracture. 3.  No acute fracture or traumatic listhesis in the cervical spine. 4. Bubbly fluid in the right maxillary sinus, as can be seen with acute sinusitis. Correlate with symptoms. Electronically Signed   By: Wiliam Ke M.D.   On:  01/01/2022 12:55  ? ?CT Cervical Spine Wo Contrast ? ?Result Date: 01/01/2022 ?CLINICAL DATA:  Fall EXAM: CT HEAD WITHOUT CONTRAST CT MAXILLOFACIAL WITHOUT CONTRAST CT CERVICAL SPINE WITHOUT CONTRAST TECHNIQUE: Multidetector CT imaging of the head, cervical spine, and maxillofacial structures were performed using the standard protocol without intravenous contrast. Multiplanar CT image reconstructions of the cervical spine and maxillofacial structures were also generated. RADIATION DOSE REDUCTION: This exam was performed according to the departmental dose-optimization program which includes automated exposure control, adjustment of the mA and/or kV according to patient size and/or use of iterative reconstruction technique. COMPARISON:  None Available. FINDINGS: CT HEAD FINDINGS Brain: No evidence of acute infarction, hemorrhage, hydrocephalus, extra-axial collection or mass lesion/mass  effect. Mineralization in the bilateral basal ganglia. Periventricular white matter changes, likely the sequela of chronic small vessel ischemic disease. Vascular: No hyperdense vessel. Atherosclerotic calcifications in the in

## 2022-01-02 NOTE — ED Provider Notes (Signed)
.  Ortho Injury Treatment ? ?Date/Time: 01/02/2022 8:49 AM ?Performed by: Lucrezia Starch, MD ?Authorized by: Lucrezia Starch, MD  ? ?Consent:  ?  Consent obtained:  Verbal ?  Consent given by:  Patient ?  Risks discussed:  Recurrent dislocation ?  Alternatives discussed:  No treatmentInjury location: shoulder ?Location details: left shoulder ?Injury type: dislocation ?Dislocation type: anterior ?Hill-Sachs deformity: no ?Chronicity: new ?Pre-procedure neurovascular assessment: neurovascularly intact ?Pre-procedure distal perfusion: normal ?Pre-procedure neurological function: normal ?Pre-procedure range of motion: reduced ? ?Anesthesia: ?Local anesthesia used: no ? ?Patient sedated: NoManipulation performed: yes ?Reduction method: Cunningham. ?Reduction successful: yes ?X-ray confirmed reduction: yes ?Immobilization: sling (Applied by ER doctor) ?Post-procedure distal perfusion: normal ?Post-procedure neurological function: normal ?Post-procedure range of motion: improved ? ? ? ?  ?Lucrezia Starch, MD ?01/02/22 [phone] ? ?

## 2022-02-02 NOTE — Therapy (Signed)
OUTPATIENT PHYSICAL THERAPY SHOULDER EVALUATION   Patient Name: Alexandra Bean MRN: [id number] DOB:01-02-1941, 81 y.o., female Today's Date: 02/03/2022   PT End of Session - 02/03/22 1011     Visit Number 1    Date for PT Re-Evaluation 03/31/22    PT Start Time [id number]    PT Stop Time 1055    PT Time Calculation (min) 40 min    Activity Tolerance Patient tolerated treatment well;Patient limited by pain    Behavior During Therapy [facility] for tasks assessed/performed             Past Medical History:  Diagnosis Date   Arthritis    Diabetes mellitus without complication ([place])    Hyperlipidemia    Hypertension    Murmur 10/13/2018   Past Surgical History:  Procedure Laterality Date   ABDOMINAL HYSTERECTOMY     pt. 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   TOTAL KNEE ARTHROPLASTY  06/20/2012   Procedure: TOTAL KNEE ARTHROPLASTY;  Surgeon: Mauri Pole, MD;  Location: [facility];  Service: Orthopedics;  Laterality: Right;   Patient Active Problem List   Diagnosis Date Noted   Nonrheumatic tricuspid valve regurgitation 10/14/2018   Controlled type 2 diabetes mellitus ([place]) 10/14/2018   Murmur 10/13/2018   Essential hypertension 10/11/2018   Nonrheumatic mitral valve regurgitation 10/11/2018   Hyperlipidemia 10/11/2018   Chest pain 10/11/2018   Hyponatremia 06/22/2012   Expected blood loss anemia 06/21/2012   Overweight (BMI 25.0-29.9) 06/21/2012   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    PCP: Eldridge Abrahams  REFERRING PROVIDER: Madalyn Rob  REFERRING DIAGJJ:[id number], W19.XXXA  THERAPY DIAG:  Acute pain of left shoulder  Muscle weakness (generalized)  Stiffness of left shoulder, not elsewhere classified  Rationale for Evaluation and Treatment Rehabilitation  ONSET DATE: 01/01/22  SUBJECTIVE:                                                                                                                                                                                       SUBJECTIVE STATEMENT: Patient reports that she is mostly in her home but this day someone came to get herbs from her garden so she went outside. As she was going back towards the house, she tripped and fell on her L shoulder. She states that she went to the emergency room and they put her shoulder back in place. Patient complains of pain when trying to lift up her arm in any direction and when going from sit to stand.   PERTINENT HISTORY: Hysterectomy  TKA 06/20/2012 DM  PAIN:  Are you having pain? Yes: NPRS scale: 5/10 Pain location:  L shoulder Pain description: achy Aggravating factors: lifting it up Relieving factors: sitting still , Tiger balm  PRECAUTIONS: Fall  WEIGHT BEARING RESTRICTIONS No  FALLS:  Has patient fallen in last 6 months? Yes. Number of falls 1  LIVING ENVIRONMENT: Lives with: lives with their family Lives in: House/apartment Stairs: Yes: Internal: 16 steps; does not go upstairs Has following equipment at home: Single point cane but does not use   OCCUPATION: Stays at home, retired  PLOF: Independent with household mobility without device  PATIENT GOALS to not have pain  OBJECTIVE:   DIAGNOSTIC FINDINGS:  EXAM: LEFT SHOULDER - 2+ VIEW   COMPARISON:  Left shoulder radiographs 01/01/2022 (earlier same day).   FINDINGS: Interval reduction of the prior anterior shoulder dislocation. The humeral head is now normally located with respect to the glenoid fossa. No definite acute fracture is seen. Mild inferior acromioclavicular joint space narrowing is again seen. There are degenerative endplate osteophytes of the thoracic spine.   IMPRESSION: Interval reduction of left shoulder.  PATIENT SURVEYS:  FOTO 50  COGNITION:  Overall cognitive status: Within functional limits for tasks assessed     SENSATION: WFL  POSTURE: Forward head, rounded shoulders  UPPER EXTREMITY ROM: seated ROM, R WFL  Active ROM Right eval Left eval  Shoulder  flexion  110 w/pain  Shoulder extension    Shoulder abduction  54 w/pain  Shoulder adduction    Shoulder internal rotation  WFL but pain  Shoulder external rotation  47 w/pain  Elbow flexion    Elbow extension    Wrist flexion    Wrist extension    Wrist ulnar deviation    Wrist radial deviation    Wrist pronation    Wrist supination    (Blank rows = not tested)  UPPER EXTREMITY MMT:  MMT Right eval Left eval  Shoulder flexion 4 2- w/pain  Shoulder extension    Shoulder abduction 4 2- w/pain  Shoulder adduction    Shoulder internal rotation 4 3  Shoulder external rotation 4 3  Middle trapezius    Lower trapezius    Elbow flexion 4 4  Elbow extension 4 4  Wrist flexion    Wrist extension    Wrist ulnar deviation    Wrist radial deviation    Wrist pronation    Wrist supination    Grip strength (lbs)    (Blank rows = not tested)  SHOULDER SPECIAL TESTS:  Impingement tests: Painful arc test: positive   Rotator cuff assessment: Drop arm test: positive  and Empty can test: positive    JOINT MOBILITY TESTING:  Increased muscle guarding with PROM   PALPATION:  Atrophy of L scapular muscles, not TTP   TODAY'S TREATMENT:  Wall slides flexion and abd x10 RedTB rows, shoulder ext RedTB ER with BUE   PATIENT EDUCATION: Education details: POC Person educated: Patient Education method: Customer service manager Education comprehension: verbalized understanding and returned demonstration   HOME EXERCISE PROGRAM: [id number]  ASSESSMENT:  CLINICAL IMPRESSION: Patient is a 81 y.o. female who was seen today for physical therapy evaluation and treatment for L shoulder dislocation following a fall. She demonstrates deficits in L shoulder ROM and strength and is having difficulty with household chores. She is still having pain in her L shoulder and results to using Tiger Balm patches to help. Patient will benefit from skilled PT intervention to address deficits found  during evaluation to allow her to do household activities and ADLs without pain.  OBJECTIVE IMPAIRMENTS Abnormal gait, decreased balance, difficulty walking, decreased ROM, decreased strength, decreased safety awareness, impaired UE functional use, and pain.   ACTIVITY LIMITATIONS carrying, lifting, reach over head, and locomotion level  PARTICIPATION LIMITATIONS: cleaning, laundry, and yard work  PERSONAL FACTORS Age and 1-2 comorbidities: arthritis, DM, HTN  are also affecting patient's functional outcome.   REHAB POTENTIAL: Good  CLINICAL DECISION MAKING: Stable/uncomplicated  EVALUATION COMPLEXITY: Low   GOALS: Goals reviewed with patient? Yes  SHORT TERM GOALS: Target date: 03/03/22  Patient will be independent with initial HEP.  Goal status: INITIAL   LONG TERM GOALS: Target date: 03/31/22  1.  Patient will report 75% improvement in L shoulder pain to improve QOL.  Goal status: INITIAL  2.   Patient to improve L shoulder AROM to [facility] without pain provocation to allow for increased ease of ADLs.  Baseline: flexion 110, abd 54, ER 47 all with pain Goal status: INITIAL  3.  Patient will demonstrate improved functional UE strength as demonstrated by >= 4/5 in L shoulder. Goal status: INITIAL  4.  Patient will report 61 on FOTO (patient outcome measure)  to demonstrate improved functional ability.  Goal status: INITIAL   PLAN: PT FREQUENCY: 2x/week  PT DURATION: 8 weeks  PLANNED INTERVENTIONS: Therapeutic exercises, Therapeutic activity, Neuromuscular re-education, Balance training, Gait training, Patient/Family education, Joint mobilization, Electrical stimulation, Cryotherapy, Moist heat, Ionotophoresis 4mg /ml Dexamethasone, and Manual therapy  PLAN FOR NEXT SESSION: Review HEP, L shoulder/scapular strengthening and [employer], PT 02/03/2022, 4:08 PM

## 2022-02-03 ENCOUNTER — Ambulatory Visit: Payer: Medicare Other | Attending: Emergency Medicine

## 2022-02-03 DIAGNOSIS — M25512 Pain in left shoulder: Secondary | ICD-10-CM | POA: Insufficient documentation

## 2022-02-03 DIAGNOSIS — M6281 Muscle weakness (generalized): Secondary | ICD-10-CM | POA: Diagnosis present

## 2022-02-03 DIAGNOSIS — M25612 Stiffness of left shoulder, not elsewhere classified: Secondary | ICD-10-CM | POA: Diagnosis present

## 2022-02-10 ENCOUNTER — Ambulatory Visit: Payer: Medicare Other

## 2022-02-10 DIAGNOSIS — M25512 Pain in left shoulder: Secondary | ICD-10-CM | POA: Diagnosis not present

## 2022-02-10 DIAGNOSIS — M25612 Stiffness of left shoulder, not elsewhere classified: Secondary | ICD-10-CM

## 2022-02-10 DIAGNOSIS — M6281 Muscle weakness (generalized): Secondary | ICD-10-CM

## 2022-02-10 NOTE — Therapy (Signed)
OUTPATIENT PHYSICAL THERAPY SHOULDER TREATMENT   Patient Name: Alexandra Bean MRN: [id number] DOB:[birth year], 81 y.o., female Today's Date: 02/10/2022   PT End of Session - 02/10/22 1146     Visit Number 2    Date for PT Re-Evaluation 03/31/22    PT Start Time 1146    PT Stop Time 1228    PT Time Calculation (min) 42 min    Activity Tolerance Patient tolerated treatment well;Patient limited by pain    Behavior During Therapy [facility] for tasks assessed/performed              Past Medical History:  Diagnosis Date   Arthritis    Diabetes mellitus without complication ([facility])    Hyperlipidemia    Hypertension    Murmur 10/13/2018   Past Surgical History:  Procedure Laterality Date   ABDOMINAL HYSTERECTOMY     pt. 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   TOTAL KNEE ARTHROPLASTY  06/20/2012   Procedure: TOTAL KNEE ARTHROPLASTY;  Surgeon: Shelda Pal, MD;  Location: [facility];  Service: Orthopedics;  Laterality: Right;   Patient Active Problem List   Diagnosis Date Noted   Nonrheumatic tricuspid valve regurgitation 10/14/2018   Controlled type 2 diabetes mellitus ([facility]) 10/14/2018   Murmur 10/13/2018   Essential hypertension 10/11/2018   Nonrheumatic mitral valve regurgitation 10/11/2018   Hyperlipidemia 10/11/2018   Chest pain 10/11/2018   Hyponatremia 06/22/2012   Expected blood loss anemia 06/21/2012   Overweight (BMI 25.0-29.9) 06/21/2012   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    PCP: Zoe Lan  REFERRING PROVIDER: Marianna Fuss  REFERRING DIAG: O35.009F, W19.XXXA  THERAPY DIAG:  Acute pain of left shoulder  Muscle weakness (generalized)  Stiffness of left shoulder, not elsewhere classified  Rationale for Evaluation and Treatment Rehabilitation  ONSET DATE: 01/01/22  SUBJECTIVE:                                                                                                                                                                                       SUBJECTIVE STATEMENT: Shoulder feels achy, is having trouble hold hands on her hip. 3/10 pain.   PERTINENT HISTORY: Hysterectomy  TKA 06/20/2012 DM  PAIN:  Are you having pain? Yes: NPRS scale: 3/10 Pain location: L shoulder Pain description: achy Aggravating factors: lifting it up Relieving factors: sitting still , Tiger balm  PRECAUTIONS: Fall  WEIGHT BEARING RESTRICTIONS No  FALLS:  Has patient fallen in last 6 months? Yes. Number of falls 1  LIVING ENVIRONMENT: Lives with: lives with their family Lives in: House/apartment Stairs: Yes: Internal: 16 steps; does not go upstairs Has following equipment  at home: Single point cane but does not use   OCCUPATION: Stays at home, retired  PLOF: Independent with household mobility without device  PATIENT GOALS to not have pain  OBJECTIVE:   DIAGNOSTIC FINDINGS:  EXAM: LEFT SHOULDER - 2+ VIEW   COMPARISON:  Left shoulder radiographs 01/01/2022 (earlier same day).   FINDINGS: Interval reduction of the prior anterior shoulder dislocation. The humeral head is now normally located with respect to the glenoid fossa. No definite acute fracture is seen. Mild inferior acromioclavicular joint space narrowing is again seen. There are degenerative endplate osteophytes of the thoracic spine.   IMPRESSION: Interval reduction of left shoulder.  PATIENT SURVEYS:  FOTO 50  COGNITION:  Overall cognitive status: Within functional limits for tasks assessed     SENSATION: WFL  POSTURE: Forward head, rounded shoulders  UPPER EXTREMITY ROM: seated ROM, R WFL  Active ROM Right eval Left eval  Shoulder flexion  110 w/pain  Shoulder extension    Shoulder abduction  54 w/pain  Shoulder adduction    Shoulder internal rotation  WFL but pain  Shoulder external rotation  47 w/pain  Elbow flexion    Elbow extension    Wrist flexion    Wrist extension    Wrist ulnar deviation    Wrist radial deviation    Wrist pronation     Wrist supination    (Blank rows = not tested)  UPPER EXTREMITY MMT:  MMT Right eval Left eval  Shoulder flexion 4 2- w/pain  Shoulder extension    Shoulder abduction 4 2- w/pain  Shoulder adduction    Shoulder internal rotation 4 3  Shoulder external rotation 4 3  Middle trapezius    Lower trapezius    Elbow flexion 4 4  Elbow extension 4 4  Wrist flexion    Wrist extension    Wrist ulnar deviation    Wrist radial deviation    Wrist pronation    Wrist supination    Grip strength (lbs)    (Blank rows = not tested)  SHOULDER SPECIAL TESTS:  Impingement tests: Painful arc test: positive   Rotator cuff assessment: Drop arm test: positive  and Empty can test: positive    JOINT MOBILITY TESTING:  Increased muscle guarding with PROM   PALPATION:  Atrophy of L scapular muscles, not TTP   TODAY'S TREATMENT:  02/10/22 UBE forwards and back Wall slides 20reps  PROM flexion/abd/ER/IR (most pain into IR and ABD)  Supine flexion 1# 2x10 TB rows and ext 2x10 ER with band 2x10 Isometric IR against ball 20reps      PATIENT EDUCATION: Education details: POC Person educated: Patient Education method: Medical illustrator Education comprehension: verbalized understanding and returned demonstration   HOME EXERCISE PROGRAM: [id number]  ASSESSMENT:  CLINICAL IMPRESSION: Patient returns to PT with decreased pain in L shoulder but still having difficulty with household chores due to weakness. Focused on AAROM for L shoulder and low level strengthening in all planes. Patient is able to tolerate PROM but has increased muscle guarding and pain into abduction and internal rotation. Continue to progress as tolerated.     OBJECTIVE IMPAIRMENTS Abnormal gait, decreased balance, difficulty walking, decreased ROM, decreased strength, decreased safety awareness, impaired UE functional use, and pain.   ACTIVITY LIMITATIONS carrying, lifting, reach over head, and  locomotion level  PARTICIPATION LIMITATIONS: cleaning, laundry, and yard work  PERSONAL FACTORS Age and 1-2 comorbidities: arthritis, DM, HTN  are also affecting patient's functional outcome.   REHAB POTENTIAL:  Good  CLINICAL DECISION MAKING: Stable/uncomplicated  EVALUATION COMPLEXITY: Low   GOALS: Goals reviewed with patient? Yes  SHORT TERM GOALS: Target date: 03/03/22  Patient will be independent with initial HEP.  Goal status: INITIAL   LONG TERM GOALS: Target date: 03/31/22  1.  Patient will report 75% improvement in L shoulder pain to improve QOL.  Goal status: INITIAL  2.   Patient to improve L shoulder AROM to [facility] without pain provocation to allow for increased ease of ADLs.  Baseline: flexion 110, abd 54, ER 47 all with pain Goal status: INITIAL  3.  Patient will demonstrate improved functional UE strength as demonstrated by >= 4/5 in L shoulder. Goal status: INITIAL  4.  Patient will report 67 on FOTO (patient outcome measure)  to demonstrate improved functional ability.  Goal status: INITIAL   PLAN: PT FREQUENCY: 2x/week  PT DURATION: 8 weeks  PLANNED INTERVENTIONS: Therapeutic exercises, Therapeutic activity, Neuromuscular re-education, Balance training, Gait training, Patient/Family education, Joint mobilization, Electrical stimulation, Cryotherapy, Moist heat, Ionotophoresis 4mg /ml Dexamethasone, and Manual therapy  PLAN FOR NEXT SESSION: Review HEP, L shoulder/scapular strengthening and , PT 02/10/2022, 12:29 PM

## 2022-02-16 ENCOUNTER — Ambulatory Visit: Payer: Medicare Other

## 2022-02-16 DIAGNOSIS — M6281 Muscle weakness (generalized): Secondary | ICD-10-CM

## 2022-02-16 DIAGNOSIS — M25612 Stiffness of left shoulder, not elsewhere classified: Secondary | ICD-10-CM

## 2022-02-16 DIAGNOSIS — M25512 Pain in left shoulder: Secondary | ICD-10-CM | POA: Diagnosis not present

## 2022-02-16 NOTE — Therapy (Signed)
OUTPATIENT PHYSICAL THERAPY SHOULDER TREATMENT   Patient Name: Alexandra Bean MRN: [id number] DOB:[birth year], 81 y.o., female Today's Date: 02/16/2022   PT End of Session - 02/16/22 1108     Visit Number 3    Date for PT Re-Evaluation 03/31/22    PT Start Time 1108    PT Stop Time 1150    PT Time Calculation (min) 42 min    Activity Tolerance Patient tolerated treatment well;Patient limited by pain    Behavior During Therapy [facility] for tasks assessed/performed               Past Medical History:  Diagnosis Date   Arthritis    Diabetes mellitus without complication (HCC)    Hyperlipidemia    Hypertension    Murmur 10/13/2018   Past Surgical History:  Procedure Laterality Date   ABDOMINAL HYSTERECTOMY     pt. 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   TOTAL KNEE ARTHROPLASTY  06/20/2012   Procedure: TOTAL KNEE ARTHROPLASTY;  Surgeon: Shelda Pal, MD;  Location: [facility];  Service: Orthopedics;  Laterality: Right;   Patient Active Problem List   Diagnosis Date Noted   Nonrheumatic tricuspid valve regurgitation 10/14/2018   Controlled type 2 diabetes mellitus (HCC) 10/14/2018   Murmur 10/13/2018   Essential hypertension 10/11/2018   Nonrheumatic mitral valve regurgitation 10/11/2018   Hyperlipidemia 10/11/2018   Chest pain 10/11/2018   Hyponatremia 06/22/2012   Expected blood loss anemia 06/21/2012   Overweight (BMI 25.0-29.9) 06/21/2012   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    PCP: Zoe Lan  REFERRING PROVIDER: Marianna Fuss  REFERRING DIAG: W09.811B, W19.XXXA  THERAPY DIAG:  Acute pain of left shoulder  Muscle weakness (generalized)  Stiffness of left shoulder, not elsewhere classified  Rationale for Evaluation and Treatment Rehabilitation  ONSET DATE: 01/01/22  SUBJECTIVE:                                                                                                                                                                                       SUBJECTIVE STATEMENT: L shoulder feels better, but I still feel it like an achy pain. Sleeping better at night.    PERTINENT HISTORY: Hysterectomy  TKA 06/20/2012 DM  PAIN:  Are you having pain? Yes: NPRS scale: 2/10 Pain location: L shoulder Pain description: achy Aggravating factors: lifting it up Relieving factors: sitting still , Tiger balm  PRECAUTIONS: Fall  WEIGHT BEARING RESTRICTIONS No  FALLS:  Has patient fallen in last 6 months? Yes. Number of falls 1  LIVING ENVIRONMENT: Lives with: lives with their family Lives in: House/apartment Stairs: Yes: Internal: 16 steps; does  not go upstairs Has following equipment at home: Single point cane but does not use   OCCUPATION: Stays at home, retired  PLOF: Independent with household mobility without device  PATIENT GOALS to not have pain  OBJECTIVE:   DIAGNOSTIC FINDINGS:  EXAM: LEFT SHOULDER - 2+ VIEW   COMPARISON:  Left shoulder radiographs 01/01/2022 (earlier same day).   FINDINGS: Interval reduction of the prior anterior shoulder dislocation. The humeral head is now normally located with respect to the glenoid fossa. No definite acute fracture is seen. Mild inferior acromioclavicular joint space narrowing is again seen. There are degenerative endplate osteophytes of the thoracic spine.   IMPRESSION: Interval reduction of left shoulder.  PATIENT SURVEYS:  FOTO 50  COGNITION:  Overall cognitive status: Within functional limits for tasks assessed     SENSATION: WFL  POSTURE: Forward head, rounded shoulders  UPPER EXTREMITY ROM: seated ROM, R WFL  Active ROM Right eval Left eval  Shoulder flexion  110 w/pain  Shoulder extension    Shoulder abduction  54 w/pain  Shoulder adduction    Shoulder internal rotation  WFL but pain  Shoulder external rotation  47 w/pain  Elbow flexion    Elbow extension    Wrist flexion    Wrist extension    Wrist ulnar deviation    Wrist radial deviation     Wrist pronation    Wrist supination    (Blank rows = not tested)  UPPER EXTREMITY MMT:  MMT Right eval Left eval  Shoulder flexion 4 2- w/pain  Shoulder extension    Shoulder abduction 4 2- w/pain  Shoulder adduction    Shoulder internal rotation 4 3  Shoulder external rotation 4 3  Middle trapezius    Lower trapezius    Elbow flexion 4 4  Elbow extension 4 4  Wrist flexion    Wrist extension    Wrist ulnar deviation    Wrist radial deviation    Wrist pronation    Wrist supination    Grip strength (lbs)    (Blank rows = not tested)  SHOULDER SPECIAL TESTS:  Impingement tests: Painful arc test: positive   Rotator cuff assessment: Drop arm test: positive  and Empty can test: positive    JOINT MOBILITY TESTING:  Increased muscle guarding with PROM   PALPATION:  Atrophy of L scapular muscles, not TTP   TODAY'S TREATMENT:  02/16/22 UBE 3 min fwd/3 back L 2 PROM left shld all motions 1# IR/ER x10 2# rod chest press 2x10 2# shoulder AAROM 2x10 1# SL ER 2x10 2 # wt bar shld ext 2x10   02/10/22 UBE forwards and back Wall slides 20reps  PROM flexion/abd/ER/IR (most pain into IR and ABD)  Supine flexion 1# 2x10 TB rows and ext 2x10 ER with band 2x10 Isometric IR against ball 20reps      PATIENT EDUCATION: Education details: POC Person educated: Patient Education method: Medical illustrator Education comprehension: verbalized understanding and returned demonstration   HOME EXERCISE PROGRAM: [id number]  ASSESSMENT:  CLINICAL IMPRESSION: Patient returns to PT with decreased pain in L shoulder and noted improvements with being able to use L arm and is sleeping better. Patient states she was unable to pull up pants with both arms when she started therapy but now she can use both arms. Continued with AAROM for L shoulder and low level strengthening for rotator cuff muscles. Patient is able to tolerate PROM but has increased muscle guarding and  pain into abduction and internal  rotation. Continue to progress as tolerated.     OBJECTIVE IMPAIRMENTS Abnormal gait, decreased balance, difficulty walking, decreased ROM, decreased strength, decreased safety awareness, impaired UE functional use, and pain.   ACTIVITY LIMITATIONS carrying, lifting, reach over head, and locomotion level  PARTICIPATION LIMITATIONS: cleaning, laundry, and yard work  PERSONAL FACTORS Age and 1-2 comorbidities: arthritis, DM, HTN  are also affecting patient's functional outcome.   REHAB POTENTIAL: Good  CLINICAL DECISION MAKING: Stable/uncomplicated  EVALUATION COMPLEXITY: Low   GOALS: Goals reviewed with patient? Yes  SHORT TERM GOALS: Target date: 03/03/22  Patient will be independent with initial HEP.  Goal status: INITIAL   LONG TERM GOALS: Target date: 03/31/22  1.  Patient will report 75% improvement in L shoulder pain to improve QOL.  Goal status: INITIAL  2.   Patient to improve L shoulder AROM to [facility] without pain provocation to allow for increased ease of ADLs.  Baseline: flexion 110, abd 54, ER 47 all with pain Goal status: INITIAL  3.  Patient will demonstrate improved functional UE strength as demonstrated by >= 4/5 in L shoulder. Goal status: INITIAL  4.  Patient will report 75 on FOTO (patient outcome measure)  to demonstrate improved functional ability.  Goal status: INITIAL   PLAN: PT FREQUENCY: 2x/week  PT DURATION: 8 weeks  PLANNED INTERVENTIONS: Therapeutic exercises, Therapeutic activity, Neuromuscular re-education, Balance training, Gait training, Patient/Family education, Joint mobilization, Electrical stimulation, Cryotherapy, Moist heat, Ionotophoresis 4mg /ml Dexamethasone, and Manual therapy  PLAN FOR NEXT SESSION: Review HEP, L shoulder/scapular strengthening and [employer], PT 02/16/2022, 11:52 AM

## 2022-02-27 ENCOUNTER — Other Ambulatory Visit: Payer: Self-pay | Admitting: Cardiology

## 2022-02-27 DIAGNOSIS — I10 Essential (primary) hypertension: Secondary | ICD-10-CM

## 2022-03-02 ENCOUNTER — Encounter: Payer: Self-pay | Admitting: Physical Therapy

## 2022-03-02 ENCOUNTER — Ambulatory Visit: Payer: Medicare Other | Attending: Emergency Medicine | Admitting: Physical Therapy

## 2022-03-02 ENCOUNTER — Ambulatory Visit: Payer: Medicare Other | Admitting: Physical Therapy

## 2022-03-02 DIAGNOSIS — M6281 Muscle weakness (generalized): Secondary | ICD-10-CM | POA: Insufficient documentation

## 2022-03-02 DIAGNOSIS — M25512 Pain in left shoulder: Secondary | ICD-10-CM | POA: Diagnosis not present

## 2022-03-02 DIAGNOSIS — M25612 Stiffness of left shoulder, not elsewhere classified: Secondary | ICD-10-CM | POA: Insufficient documentation

## 2022-03-02 NOTE — Therapy (Signed)
OUTPATIENT PHYSICAL THERAPY SHOULDER TREATMENT   Patient Name: Alexandra Bean MRN: [id number] DOB:04/20/1941, 81 y.o., female Today's Date: 03/02/2022   PT End of Session - 03/02/22 1019     Visit Number 4    Date for PT Re-Evaluation 03/31/22    PT Start Time 1015    PT Stop Time 1100    PT Time Calculation (min) 45 min    Activity Tolerance Patient tolerated treatment well;Patient limited by pain    Behavior During Therapy [facility] for tasks assessed/performed               Past Medical History:  Diagnosis Date   Arthritis    Diabetes mellitus without complication (HCC)    Hyperlipidemia    Hypertension    Murmur 10/13/2018   Past Surgical History:  Procedure Laterality Date   ABDOMINAL HYSTERECTOMY     pt. 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   TOTAL KNEE ARTHROPLASTY  06/20/2012   Procedure: TOTAL KNEE ARTHROPLASTY;  Surgeon: Shelda Pal, MD;  Location: [facility];  Service: Orthopedics;  Laterality: Right;   Patient Active Problem List   Diagnosis Date Noted   Nonrheumatic tricuspid valve regurgitation 10/14/2018   Controlled type 2 diabetes mellitus (HCC) 10/14/2018   Murmur 10/13/2018   Essential hypertension 10/11/2018   Nonrheumatic mitral valve regurgitation 10/11/2018   Hyperlipidemia 10/11/2018   Chest pain 10/11/2018   Hyponatremia 06/22/2012   Expected blood loss anemia 06/21/2012   Overweight (BMI 25.0-29.9) 06/21/2012   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    PCP: Zoe Lan  REFERRING PROVIDER: Marianna Fuss  REFERRING DIAG: N02.725D, W19.XXXA  THERAPY DIAG:  Acute pain of left shoulder  Muscle weakness (generalized)  Stiffness of left shoulder, not elsewhere classified  Rationale for Evaluation and Treatment Rehabilitation  ONSET DATE: 01/01/22  SUBJECTIVE:                                                                                                                                                                                       SUBJECTIVE STATEMENT: No pain except a little at night, but getting better.   PERTINENT HISTORY: Hysterectomy  TKA 06/20/2012 DM  PAIN:  Are you having pain? No  PRECAUTIONS: Fall  WEIGHT BEARING RESTRICTIONS No  FALLS:  Has patient fallen in last 6 months? Yes. Number of falls 1  LIVING ENVIRONMENT: Lives with: lives with their family Lives in: House/apartment Stairs: Yes: Internal: 16 steps; does not go upstairs Has following equipment at home: Single point cane but does not use   OCCUPATION: Stays at home, retired  PLOF: Independent with household mobility without device  PATIENT GOALS to not have pain  OBJECTIVE:   DIAGNOSTIC FINDINGS:  EXAM: LEFT SHOULDER - 2+ VIEW   COMPARISON:  Left shoulder radiographs 01/01/2022 (earlier same day).   FINDINGS: Interval reduction of the prior anterior shoulder dislocation. The humeral head is now normally located with respect to the glenoid fossa. No definite acute fracture is seen. Mild inferior acromioclavicular joint space narrowing is again seen. There are degenerative endplate osteophytes of the thoracic spine.   IMPRESSION: Interval reduction of left shoulder.  PATIENT SURVEYS:  FOTO 50  COGNITION:  Overall cognitive status: Within functional limits for tasks assessed     SENSATION: WFL  POSTURE: Forward head, rounded shoulders  UPPER EXTREMITY ROM: seated ROM, R WFL  Active ROM Right eval Left eval  Shoulder flexion  110 w/pain  Shoulder extension    Shoulder abduction  54 w/pain  Shoulder adduction    Shoulder internal rotation  WFL but pain  Shoulder external rotation  47 w/pain  Elbow flexion    Elbow extension    Wrist flexion    Wrist extension    Wrist ulnar deviation    Wrist radial deviation    Wrist pronation    Wrist supination    (Blank rows = not tested)  UPPER EXTREMITY MMT:  MMT Right eval Left eval  Shoulder flexion 4 2- w/pain  Shoulder extension    Shoulder  abduction 4 2- w/pain  Shoulder adduction    Shoulder internal rotation 4 3  Shoulder external rotation 4 3  Middle trapezius    Lower trapezius    Elbow flexion 4 4  Elbow extension 4 4  Wrist flexion    Wrist extension    Wrist ulnar deviation    Wrist radial deviation    Wrist pronation    Wrist supination    Grip strength (lbs)    (Blank rows = not tested)  SHOULDER SPECIAL TESTS:  Impingement tests: Painful arc test: positive   Rotator cuff assessment: Drop arm test: positive  and Empty can test: positive    JOINT MOBILITY TESTING:  Increased muscle guarding with PROM   PALPATION:  Atrophy of L scapular muscles, not TTP   TODAY'S TREATMENT:  03/02/22 NuStep L4 x 6 min PROM L shoulder, most pain with abduction  AAROM shoulder flexion/extension/IR with 1# WaTE 2x10 1# IR/ER x10 Rows with red TB 2x10 ER with red TB 2x10 L shoulder abduction AROM 2x10   02/16/22 UBE 3 min fwd/3 back L 2 PROM left shld all motions 1# IR/ER x10 2# rod chest press 2x10 2# shoulder AAROM 2x10 1# SL ER 2x10 2 # wt bar shld ext 2x10   02/10/22 UBE forwards and back Wall slides 20reps  PROM flexion/abd/ER/IR (most pain into IR and ABD)  Supine flexion 1# 2x10 TB rows and ext 2x10 ER with band 2x10 Isometric IR against ball 20reps      PATIENT EDUCATION: Education details: POC Person educated: Patient Education method: Medical illustrator Education comprehension: verbalized understanding and returned demonstration   HOME EXERCISE PROGRAM: [id number]  ASSESSMENT:  CLINICAL IMPRESSION: Pt enters today reporting no pain and states the most of her pain is at night, but has been improving. Pt has most pain with shoulder abduction, however is showing improvement in range of motion. Pt states she is able to do more functional activities at home with less pain. Pt would benefit from additional ROM and strengthening of left shoulder.     OBJECTIVE IMPAIRMENTS  Abnormal  gait, decreased balance, difficulty walking, decreased ROM, decreased strength, decreased safety awareness, impaired UE functional use, and pain.   ACTIVITY LIMITATIONS carrying, lifting, reach over head, and locomotion level  PARTICIPATION LIMITATIONS: cleaning, laundry, and yard work  PERSONAL FACTORS Age and 1-2 comorbidities: arthritis, DM, HTN  are also affecting patient's functional outcome.   REHAB POTENTIAL: Good  CLINICAL DECISION MAKING: Stable/uncomplicated  EVALUATION COMPLEXITY: Low   GOALS: Goals reviewed with patient? Yes  SHORT TERM GOALS: Target date: 03/03/22  Patient will be independent with initial HEP.  Goal status: INITIAL   LONG TERM GOALS: Target date: 03/31/22  1.  Patient will report 75% improvement in L shoulder pain to improve QOL.  Goal status: INITIAL  2.   Patient to improve L shoulder AROM to [facility] without pain provocation to allow for increased ease of ADLs.  Baseline: flexion 110, abd 54, ER 47 all with pain Goal status: INITIAL  3.  Patient will demonstrate improved functional UE strength as demonstrated by >= 4/5 in L shoulder. Goal status: INITIAL  4.  Patient will report 32 on FOTO (patient outcome measure)  to demonstrate improved functional ability.  Goal status: INITIAL   PLAN: PT FREQUENCY: 2x/week  PT DURATION: 8 weeks  PLANNED INTERVENTIONS: Therapeutic exercises, Therapeutic activity, Neuromuscular re-education, Balance training, Gait training, Patient/Family education, Joint mobilization, Electrical stimulation, Cryotherapy, Moist heat, Ionotophoresis 4mg /ml Dexamethasone, and Manual therapy  PLAN FOR NEXT SESSION: Review HEP, L shoulder/scapular strengthening and AAROM   Danyell Shader, SPTA 03/02/2022, 10:20 AM

## 2022-03-16 NOTE — Therapy (Signed)
OUTPATIENT PHYSICAL THERAPY SHOULDER TREATMENT   Patient Name: Alexandra Bean MRN: [id number] DOB:Aug 27, 1940, 81 y.o., female Today's Date: 03/17/2022   PT End of Session - 03/17/22 1308     Visit Number 5    Date for PT Re-Evaluation 03/31/22    PT Start Time 1308    PT Stop Time 1351    PT Time Calculation (min) 43 min    Activity Tolerance Patient tolerated treatment well;Patient limited by pain    Behavior During Therapy [facility] for tasks assessed/performed                Past Medical History:  Diagnosis Date   Arthritis    Diabetes mellitus without complication (HCC)    Hyperlipidemia    Hypertension    Murmur 10/13/2018   Past Surgical History:  Procedure Laterality Date   ABDOMINAL HYSTERECTOMY     pt. 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   TOTAL KNEE ARTHROPLASTY  06/20/2012   Procedure: TOTAL KNEE ARTHROPLASTY;  Surgeon: Shelda Pal, MD;  Location: [facility];  Service: Orthopedics;  Laterality: Right;   Patient Active Problem List   Diagnosis Date Noted   Nonrheumatic tricuspid valve regurgitation 10/14/2018   Controlled type 2 diabetes mellitus (HCC) 10/14/2018   Murmur 10/13/2018   Essential hypertension 10/11/2018   Nonrheumatic mitral valve regurgitation 10/11/2018   Hyperlipidemia 10/11/2018   Chest pain 10/11/2018   Hyponatremia 06/22/2012   Expected blood loss anemia 06/21/2012   Overweight (BMI 25.0-29.9) 06/21/2012   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    PCP: Zoe Lan  REFERRING PROVIDER: Marianna Fuss  REFERRING DIAG: W29.937J, W19.XXXA  THERAPY DIAG:  Acute pain of left shoulder  Muscle weakness (generalized)  Stiffness of left shoulder, not elsewhere classified  Rationale for Evaluation and Treatment Rehabilitation  ONSET DATE: 01/01/22  SUBJECTIVE:                                                                                                                                                                                       SUBJECTIVE STATEMENT: L shoulder pain has gotten better, is a 2/10  PERTINENT HISTORY: Hysterectomy  TKA 06/20/2012 DM  PAIN:  Are you having pain? No  PRECAUTIONS: Fall  WEIGHT BEARING RESTRICTIONS No  FALLS:  Has patient fallen in last 6 months? Yes. Number of falls 1  LIVING ENVIRONMENT: Lives with: lives with their family Lives in: House/apartment Stairs: Yes: Internal: 16 steps; does not go upstairs Has following equipment at home: Single point cane but does not use   OCCUPATION: Stays at home, retired  PLOF: Independent with household mobility without device  PATIENT GOALS to not have pain  OBJECTIVE:   DIAGNOSTIC FINDINGS:  EXAM: LEFT SHOULDER - 2+ VIEW   COMPARISON:  Left shoulder radiographs 01/01/2022 (earlier same day).   FINDINGS: Interval reduction of the prior anterior shoulder dislocation. The humeral head is now normally located with respect to the glenoid fossa. No definite acute fracture is seen. Mild inferior acromioclavicular joint space narrowing is again seen. There are degenerative endplate osteophytes of the thoracic spine.   IMPRESSION: Interval reduction of left shoulder.  PATIENT SURVEYS:  FOTO 50  COGNITION:  Overall cognitive status: Within functional limits for tasks assessed     SENSATION: WFL  POSTURE: Forward head, rounded shoulders  UPPER EXTREMITY ROM: seated ROM, R WFL  Active ROM Right eval Left eval  Shoulder flexion  110 w/pain  Shoulder extension    Shoulder abduction  54 w/pain  Shoulder adduction    Shoulder internal rotation  WFL but pain  Shoulder external rotation  47 w/pain  Elbow flexion    Elbow extension    Wrist flexion    Wrist extension    Wrist ulnar deviation    Wrist radial deviation    Wrist pronation    Wrist supination    (Blank rows = not tested)  UPPER EXTREMITY MMT:  MMT Right eval Left eval  Shoulder flexion 4 2- w/pain  Shoulder extension    Shoulder  abduction 4 2- w/pain  Shoulder adduction    Shoulder internal rotation 4 3  Shoulder external rotation 4 3  Middle trapezius    Lower trapezius    Elbow flexion 4 4  Elbow extension 4 4  Wrist flexion    Wrist extension    Wrist ulnar deviation    Wrist radial deviation    Wrist pronation    Wrist supination    Grip strength (lbs)    (Blank rows = not tested)  SHOULDER SPECIAL TESTS:  Impingement tests: Painful arc test: positive   Rotator cuff assessment: Drop arm test: positive  and Empty can test: positive    JOINT MOBILITY TESTING:  Increased muscle guarding with PROM   PALPATION:  Atrophy of L scapular muscles, not TTP   TODAY'S TREATMENT:  03/17/22 UBE L2 x61mins  PROM all ways AAROM 2# WaTE 2x10 SA punches 2# 2x10 Cable rows 5# 2x10 Shoulder ext 5# 2x10  1# scaption 2x10 1# lateral raise 2x10    03/02/22 NuStep L4 x 6 min PROM L shoulder, most pain with abduction  AAROM shoulder flexion/extension/IR with 1# WaTE 2x10 1# IR/ER x10 Rows with red TB 2x10 ER with red TB 2x10 L shoulder abduction AROM 2x10   02/16/22 UBE 3 min fwd/3 back L 2 PROM left shld all motions 1# IR/ER x10 2# rod chest press 2x10 2# shoulder AAROM 2x10 1# SL ER 2x10 2 # wt bar shld ext 2x10   02/10/22 UBE forwards and back Wall slides 20reps  PROM flexion/abd/ER/IR (most pain into IR and ABD)  Supine flexion 1# 2x10 TB rows and ext 2x10 ER with band 2x10 Isometric IR against ball 20reps      PATIENT EDUCATION: Education details: POC Person educated: Patient Education method: Medical illustrator Education comprehension: verbalized understanding and returned demonstration   HOME EXERCISE PROGRAM: [id number]  ASSESSMENT:  CLINICAL IMPRESSION: Patient still presents with muscle guarding and pain with PROM especially into flexion and abduction. Continued working L shoulder strengthening with low weights as tolerated. Difficulty with scaption and lateral  raises, compensates once  she starts to fatigue. Interpreter present and did a good job helping with verbal cues throughout session.    OBJECTIVE IMPAIRMENTS Abnormal gait, decreased balance, difficulty walking, decreased ROM, decreased strength, decreased safety awareness, impaired UE functional use, and pain.   ACTIVITY LIMITATIONS carrying, lifting, reach over head, and locomotion level  PARTICIPATION LIMITATIONS: cleaning, laundry, and yard work  PERSONAL FACTORS Age and 1-2 comorbidities: arthritis, DM, HTN  are also affecting patient's functional outcome.   REHAB POTENTIAL: Good  CLINICAL DECISION MAKING: Stable/uncomplicated  EVALUATION COMPLEXITY: Low   GOALS: Goals reviewed with patient? Yes  SHORT TERM GOALS: Target date: 03/03/22  Patient will be independent with initial HEP.  Goal status: INITIAL   LONG TERM GOALS: Target date: 03/31/22  1.  Patient will report 75% improvement in L shoulder pain to improve QOL.  Goal status: INITIAL  2.   Patient to improve L shoulder AROM to [facility] without pain provocation to allow for increased ease of ADLs.  Baseline: flexion 110, abd 54, ER 47 all with pain Goal status: INITIAL  3.  Patient will demonstrate improved functional UE strength as demonstrated by >= 4/5 in L shoulder. Goal status: INITIAL  4.  Patient will report 18 on FOTO (patient outcome measure)  to demonstrate improved functional ability.  Goal status: INITIAL   PLAN: PT FREQUENCY: 2x/week  PT DURATION: 8 weeks  PLANNED INTERVENTIONS: Therapeutic exercises, Therapeutic activity, Neuromuscular re-education, Balance training, Gait training, Patient/Family education, Joint mobilization, Electrical stimulation, Cryotherapy, Moist heat, Ionotophoresis 4mg /ml Dexamethasone, and Manual therapy  PLAN FOR NEXT SESSION: Review HEP, L shoulder/scapular strengthening and , PT 03/17/2022, 1:52 PM

## 2022-03-17 ENCOUNTER — Ambulatory Visit: Payer: Medicare Other | Admitting: Physical Therapy

## 2022-03-17 ENCOUNTER — Ambulatory Visit: Payer: Medicare Other

## 2022-03-17 DIAGNOSIS — M25612 Stiffness of left shoulder, not elsewhere classified: Secondary | ICD-10-CM

## 2022-03-17 DIAGNOSIS — M25512 Pain in left shoulder: Secondary | ICD-10-CM

## 2022-03-17 DIAGNOSIS — M6281 Muscle weakness (generalized): Secondary | ICD-10-CM

## 2022-03-26 ENCOUNTER — Ambulatory Visit: Payer: Medicare Other | Attending: Emergency Medicine | Admitting: Physical Therapy

## 2022-03-26 ENCOUNTER — Encounter: Payer: Self-pay | Admitting: Physical Therapy

## 2022-03-26 DIAGNOSIS — M25612 Stiffness of left shoulder, not elsewhere classified: Secondary | ICD-10-CM

## 2022-03-26 DIAGNOSIS — M25512 Pain in left shoulder: Secondary | ICD-10-CM

## 2022-03-26 DIAGNOSIS — M6281 Muscle weakness (generalized): Secondary | ICD-10-CM

## 2022-03-26 NOTE — Therapy (Signed)
OUTPATIENT PHYSICAL THERAPY SHOULDER TREATMENT   Patient Name: Alexandra Bean MRN: [id number] DOB:11-23-1940, 81 y.o., female Today's Date: 03/26/2022   PT End of Session - 03/26/22 1259     Visit Number 6    Date for PT Re-Evaluation 03/31/22    PT Start Time 1300    PT Stop Time 1345    PT Time Calculation (min) 45 min    Activity Tolerance Patient tolerated treatment well;Patient limited by pain    Behavior During Therapy [facility] for tasks assessed/performed                Past Medical History:  Diagnosis Date   Arthritis    Diabetes mellitus without complication (HCC)    Hyperlipidemia    Hypertension    Murmur 10/13/2018   Past Surgical History:  Procedure Laterality Date   ABDOMINAL HYSTERECTOMY     pt. 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   TOTAL KNEE ARTHROPLASTY  06/20/2012   Procedure: TOTAL KNEE ARTHROPLASTY;  Surgeon: Shelda Pal, MD;  Location: [facility];  Service: Orthopedics;  Laterality: Right;   Patient Active Problem List   Diagnosis Date Noted   Nonrheumatic tricuspid valve regurgitation 10/14/2018   Controlled type 2 diabetes mellitus (HCC) 10/14/2018   Murmur 10/13/2018   Essential hypertension 10/11/2018   Nonrheumatic mitral valve regurgitation 10/11/2018   Hyperlipidemia 10/11/2018   Chest pain 10/11/2018   Hyponatremia 06/22/2012   Expected blood loss anemia 06/21/2012   Overweight (BMI 25.0-29.9) 06/21/2012   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    PCP: Zoe Lan  REFERRING PROVIDER: Marianna Fuss  REFERRING DIAG: W97.989Q, W19.XXXA  THERAPY DIAG:  Acute pain of left shoulder  Muscle weakness (generalized)  Stiffness of left shoulder, not elsewhere classified  Rationale for Evaluation and Treatment Rehabilitation  ONSET DATE: 01/01/22  SUBJECTIVE:                                                                                                                                                                                       SUBJECTIVE STATEMENT: Doing good overall  PERTINENT HISTORY: Hysterectomy  TKA 06/20/2012 DM  PAIN:  Are you having pain? No  PRECAUTIONS: Fall  WEIGHT BEARING RESTRICTIONS No  FALLS:  Has patient fallen in last 6 months? Yes. Number of falls 1  LIVING ENVIRONMENT: Lives with: lives with their family Lives in: House/apartment Stairs: Yes: Internal: 16 steps; does not go upstairs Has following equipment at home: Single point cane but does not use   OCCUPATION: Stays at home, retired  PLOF: Independent with household mobility without device  PATIENT GOALS to not have pain  OBJECTIVE:   DIAGNOSTIC FINDINGS:  EXAM: LEFT SHOULDER - 2+ VIEW   COMPARISON:  Left shoulder radiographs 01/01/2022 (earlier same day).   FINDINGS: Interval reduction of the prior anterior shoulder dislocation. The humeral head is now normally located with respect to the glenoid fossa. No definite acute fracture is seen. Mild inferior acromioclavicular joint space narrowing is again seen. There are degenerative endplate osteophytes of the thoracic spine.   IMPRESSION: Interval reduction of left shoulder.  PATIENT SURVEYS:  FOTO 50  COGNITION:  Overall cognitive status: Within functional limits for tasks assessed     SENSATION: WFL  POSTURE: Forward head, rounded shoulders  UPPER EXTREMITY ROM: seated ROM, R WFL  Active ROM Right eval Left eval  Shoulder flexion  110 w/pain  Shoulder extension    Shoulder abduction  54 w/pain  Shoulder adduction    Shoulder internal rotation  WFL but pain  Shoulder external rotation  47 w/pain  Elbow flexion    Elbow extension    Wrist flexion    Wrist extension    Wrist ulnar deviation    Wrist radial deviation    Wrist pronation    Wrist supination    (Blank rows = not tested)  UPPER EXTREMITY MMT:  MMT Right eval Left eval  Shoulder flexion 4 2- w/pain  Shoulder extension    Shoulder abduction 4 2- w/pain  Shoulder  adduction    Shoulder internal rotation 4 3  Shoulder external rotation 4 3  Middle trapezius    Lower trapezius    Elbow flexion 4 4  Elbow extension 4 4  Wrist flexion    Wrist extension    Wrist ulnar deviation    Wrist radial deviation    Wrist pronation    Wrist supination    Grip strength (lbs)    (Blank rows = not tested)  SHOULDER SPECIAL TESTS:  Impingement tests: Painful arc test: positive   Rotator cuff assessment: Drop arm test: positive  and Empty can test: positive    JOINT MOBILITY TESTING:  Increased muscle guarding with PROM   PALPATION:  Atrophy of L scapular muscles, not TTP   TODAY'S TREATMENT:  03/26/22 UBE L1.5 x 3 min each AAROM 3lb WaTE flex, Ext, IR x10 each Rows red 2x10 Shoulder Ext yellow 2x10 Shoulder ER yellow 2x10  Shoulder flex 1lb 2x10  Shoulder Abd 2x10 Triceps Ext LUE yellow 2x10   03/17/22 UBE L2 x11mins  PROM all ways AAROM 2# WaTE 2x10 SA punches 2# 2x10 Cable rows 5# 2x10 Shoulder ext 5# 2x10  1# scaption 2x10 1# lateral raise 2x10    03/02/22 NuStep L4 x 6 min PROM L shoulder, most pain with abduction  AAROM shoulder flexion/extension/IR with 1# WaTE 2x10 1# IR/ER x10 Rows with red TB 2x10 ER with red TB 2x10 L shoulder abduction AROM 2x10   02/16/22 UBE 3 min fwd/3 back L 2 PROM left shld all motions 1# IR/ER x10 2# rod chest press 2x10 2# shoulder AAROM 2x10 1# SL ER 2x10 2 # wt bar shld ext 2x10   02/10/22 UBE forwards and back Wall slides 20reps  PROM flexion/abd/ER/IR (most pain into IR and ABD)  Supine flexion 1# 2x10 TB rows and ext 2x10 ER with band 2x10 Isometric IR against ball 20reps      PATIENT EDUCATION: Education details: POC Person educated: Patient Education method: Medical illustrator Education comprehension: verbalized understanding and returned demonstration   HOME EXERCISE PROGRAM: [id number]  ASSESSMENT:  CLINICAL  IMPRESSION: Continued working L shoulder  strengthening with low weights as tolerated. Difficulty with lateral raises, compensates once she starts to fatigue. Postural cues required with seated rows and standing shoulder extensions. Interpreter present and did a good job helping with verbal cues throughout session.    OBJECTIVE IMPAIRMENTS Abnormal gait, decreased balance, difficulty walking, decreased ROM, decreased strength, decreased safety awareness, impaired UE functional use, and pain.   ACTIVITY LIMITATIONS carrying, lifting, reach over head, and locomotion level  PARTICIPATION LIMITATIONS: cleaning, laundry, and yard work  PERSONAL FACTORS Age and 1-2 comorbidities: arthritis, DM, HTN  are also affecting patient's functional outcome.   REHAB POTENTIAL: Good  CLINICAL DECISION MAKING: Stable/uncomplicated  EVALUATION COMPLEXITY: Low   GOALS: Goals reviewed with patient? Yes  SHORT TERM GOALS: Target date: 03/03/22  Patient will be independent with initial HEP.  Goal status: INITIAL   LONG TERM GOALS: Target date: 03/31/22  1.  Patient will report 75% improvement in L shoulder pain to improve QOL.  Goal status: INITIAL  2.   Patient to improve L shoulder AROM to [facility] without pain provocation to allow for increased ease of ADLs.  Baseline: flexion 110, abd 54, ER 47 all with pain Goal status: INITIAL  3.  Patient will demonstrate improved functional UE strength as demonstrated by >= 4/5 in L shoulder. Goal status: INITIAL  4.  Patient will report 26 on FOTO (patient outcome measure)  to demonstrate improved functional ability.  Goal status: INITIAL   PLAN: PT FREQUENCY: 2x/week  PT DURATION: 8 weeks  PLANNED INTERVENTIONS: Therapeutic exercises, Therapeutic activity, Neuromuscular re-education, Balance training, Gait training, Patient/Family education, Joint mobilization, Electrical stimulation, Cryotherapy, Moist heat, Ionotophoresis 4mg /ml Dexamethasone, and Manual therapy  PLAN FOR NEXT SESSION: Review  HEP, L shoulder/scapular strengthening and Lequita Asal, PTA 03/26/2022, 1:00 PM

## 2022-04-08 ENCOUNTER — Encounter: Payer: Self-pay | Admitting: Physical Therapy

## 2022-04-08 ENCOUNTER — Ambulatory Visit: Payer: Medicare Other | Admitting: Physical Therapy

## 2022-04-08 DIAGNOSIS — M25512 Pain in left shoulder: Secondary | ICD-10-CM

## 2022-04-08 DIAGNOSIS — M6281 Muscle weakness (generalized): Secondary | ICD-10-CM

## 2022-04-08 DIAGNOSIS — M25612 Stiffness of left shoulder, not elsewhere classified: Secondary | ICD-10-CM

## 2022-04-08 NOTE — Therapy (Addendum)
OUTPATIENT PHYSICAL THERAPY SHOULDER TREATMENT   Patient Name: Alexandra Bean MRN: [id number] DOB:1940-12-07, 81 y.o., female Today's Date: 04/08/2022   PT End of Session - 04/08/22 1301     Visit Number 7    Date for PT Re-Evaluation 03/31/22    PT Start Time 1300    PT Stop Time 8786    PT Time Calculation (min) 45 min    Activity Tolerance Patient tolerated treatment well    Behavior During Therapy [facility] for tasks assessed/performed                Past Medical History:  Diagnosis Date   Arthritis    Diabetes mellitus without complication ([place])    Hyperlipidemia    Hypertension    Murmur 10/13/2018   Past Surgical History:  Procedure Laterality Date   ABDOMINAL HYSTERECTOMY     pt. 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   TOTAL KNEE ARTHROPLASTY  06/20/2012   Procedure: TOTAL KNEE ARTHROPLASTY;  Surgeon: Mauri Pole, MD;  Location: [facility];  Service: Orthopedics;  Laterality: Right;   Patient Active Problem List   Diagnosis Date Noted   Nonrheumatic tricuspid valve regurgitation 10/14/2018   Controlled type 2 diabetes mellitus ([place]) 10/14/2018   Murmur 10/13/2018   Essential hypertension 10/11/2018   Nonrheumatic mitral valve regurgitation 10/11/2018   Hyperlipidemia 10/11/2018   Chest pain 10/11/2018   Hyponatremia 06/22/2012   Expected blood loss anemia 06/21/2012   Overweight (BMI 25.0-29.9) 06/21/2012   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    PCP: Eldridge Abrahams  REFERRING PROVIDER: Madalyn Rob  REFERRING DIAG: V67.209O, W19.XXXA  THERAPY DIAG:  Acute pain of left shoulder  Muscle weakness (generalized)  Stiffness of left shoulder, not elsewhere classified  Rationale for Evaluation and Treatment Rehabilitation  ONSET DATE: 01/01/22  SUBJECTIVE:                                                                                                                                                                                      SUBJECTIVE  STATEMENT: Still has some pain, but mush better, no falls  PERTINENT HISTORY: Hysterectomy  TKA 06/20/2012 DM  PAIN:  Are you having pain? No  PRECAUTIONS: Fall  WEIGHT BEARING RESTRICTIONS No  FALLS:  Has patient fallen in last 6 months? Yes. Number of falls 1  LIVING ENVIRONMENT: Lives with: lives with their family Lives in: House/apartment Stairs: Yes: Internal: 16 steps; does not go upstairs Has following equipment at home: Single point cane but does not use   OCCUPATION: Stays at home, retired  PLOF: Independent with household mobility without device  PATIENT GOALS to  not have pain  OBJECTIVE:   DIAGNOSTIC FINDINGS:  EXAM: LEFT SHOULDER - 2+ VIEW   COMPARISON:  Left shoulder radiographs 01/01/2022 (earlier same day).   FINDINGS: Interval reduction of the prior anterior shoulder dislocation. The humeral head is now normally located with respect to the glenoid fossa. No definite acute fracture is seen. Mild inferior acromioclavicular joint space narrowing is again seen. There are degenerative endplate osteophytes of the thoracic spine.   IMPRESSION: Interval reduction of left shoulder.      SENSATION: WFL  POSTURE: Forward head, rounded shoulders  UPPER EXTREMITY ROM: seated ROM, R WFL  Active ROM Right eval Left eval Left 04/08/22  Shoulder flexion  110 w/pain 140 w/  pain  Shoulder extension     Shoulder abduction  54 w/pain 95 w/ pain  Shoulder adduction     Shoulder internal rotation  WFL but pain   Shoulder external rotation  47 w/pain 75  Elbow flexion     Elbow extension     Wrist flexion     Wrist extension     Wrist ulnar deviation     Wrist radial deviation     Wrist pronation     Wrist supination     (Blank rows = not tested)  UPPER EXTREMITY MMT:  MMT Right eval Left eval  Shoulder flexion 4 2- w/pain  Shoulder extension    Shoulder abduction 4 2- w/pain  Shoulder adduction    Shoulder internal rotation 4 3  Shoulder  external rotation 4 3  Middle trapezius    Lower trapezius    Elbow flexion 4 4  Elbow extension 4 4  Wrist flexion    Wrist extension    Wrist ulnar deviation    Wrist radial deviation    Wrist pronation    Wrist supination    Grip strength (lbs)    (Blank rows = not tested)  SHOULDER SPECIAL TESTS:  Impingement tests: Painful arc test: positive   Rotator cuff assessment: Drop arm test: positive  and Empty can test: positive    JOINT MOBILITY TESTING:  Increased muscle guarding with PROM   PALPATION:  Atrophy of L scapular muscles, not TTP   TODAY'S TREATMENT:  04/08/22 UBE L1 x 3 min each  AROM AAROM 3lb WaTE flex, Ext, IR x10 each Chest press 3lb WaTE 2x5  Shoulder Ext yellow 2x10 Rows yellow 2x10  ER yellow 2x10  LUE PROM in all directions    03/26/22 UBE L1.5 x 3 min each AAROM 3lb WaTE flex, Ext, IR x10 each Rows red 2x10 Shoulder Ext yellow 2x10 Shoulder ER yellow 2x10  Shoulder flex 1lb 2x10  Shoulder Abd 2x10 Triceps Ext LUE yellow 2x10   03/17/22 UBE L2 x37mins  PROM all ways AAROM 2# WaTE 2x10 SA punches 2# 2x10 Cable rows 5# 2x10 Shoulder ext 5# 2x10  1# scaption 2x10 1# lateral raise 2x10    03/02/22 NuStep L4 x 6 min PROM L shoulder, most pain with abduction  AAROM shoulder flexion/extension/IR with 1# WaTE 2x10 1# IR/ER x10 Rows with red TB 2x10 ER with red TB 2x10 L shoulder abduction AROM 2x10     PATIENT EDUCATION: Education details: POC Person educated: Patient Education method: Customer service manager Education comprehension: verbalized understanding and returned demonstration   HOME EXERCISE PROGRAM: [id number]  ASSESSMENT:  CLINICAL IMPRESSION: Pt has progressed increasing her L shoulder AROM in all directions. Continued working L shoulder strengthening with low weights as tolerated. Postural cues required with seated  rows and standing shoulder extensions. Cue needed to relax with MT. Positive response with PROM  evident by improved active motion. Interpreter present and did a good job helping with verbal cues throughout session.    OBJECTIVE IMPAIRMENTS Abnormal gait, decreased balance, difficulty walking, decreased ROM, decreased strength, decreased safety awareness, impaired UE functional use, and pain.   ACTIVITY LIMITATIONS carrying, lifting, reach over head, and locomotion level  PARTICIPATION LIMITATIONS: cleaning, laundry, and yard work  PERSONAL FACTORS Age and 1-2 comorbidities: arthritis, DM, HTN  are also affecting patient's functional outcome.   REHAB POTENTIAL: Good  CLINICAL DECISION MAKING: Stable/uncomplicated  EVALUATION COMPLEXITY: Low   GOALS: Goals reviewed with patient? Yes  SHORT TERM GOALS: Target date: 03/03/22  Patient will be independent with initial HEP.  Goal status: met   LONG TERM GOALS: Target date: 05/13/22  1.  Patient will report 75% improvement in L shoulder pain to improve QOL.  Goal status: progressing  2.   Patient to improve L shoulder AROM to [facility] without pain provocation to allow for increased ease of ADLs.  Baseline: flexion 110, abd 54, ER 47 all with pain Goal status: progressing  3.  Patient will demonstrate improved functional UE strength as demonstrated by >= 4/5 in L shoulder. Goal status: INITIAL  4.  Patient will report 43 on FOTO (patient outcome measure)  to demonstrate improved functional ability.  Goal status: INITIAL   PLAN: PT FREQUENCY: 2x/week  PT DURATION: 8 weeks  PLANNED INTERVENTIONS: Therapeutic exercises, Therapeutic activity, Neuromuscular re-education, Balance training, Gait training, Patient/Family education, Joint mobilization, Electrical stimulation, Cryotherapy, Moist heat, Ionotophoresis 4mg /ml Dexamethasone, and Manual therapy  PLAN FOR NEXT SESSION: Review HEP, L shoulder/scapular strengthening and Lequita Asal, PTA 04/08/2022, 1:02 PM

## 2022-04-08 NOTE — Addendum Note (Signed)
Addended by: Cassie Freer on: 04/08/2022 03:53 PM   Modules accepted: Orders

## 2022-04-15 ENCOUNTER — Encounter: Payer: Self-pay | Admitting: Physical Therapy

## 2022-04-15 ENCOUNTER — Ambulatory Visit: Payer: Medicare Other | Admitting: Physical Therapy

## 2022-04-15 DIAGNOSIS — M25512 Pain in left shoulder: Secondary | ICD-10-CM

## 2022-04-15 DIAGNOSIS — M6281 Muscle weakness (generalized): Secondary | ICD-10-CM

## 2022-04-15 DIAGNOSIS — M25612 Stiffness of left shoulder, not elsewhere classified: Secondary | ICD-10-CM

## 2022-04-15 NOTE — Therapy (Signed)
OUTPATIENT PHYSICAL THERAPY SHOULDER TREATMENT   Patient Name: Alexandra Bean MRN: [id number] DOB:[birth year], 81 y.o., female Today's Date: 04/15/2022   PT End of Session - 04/15/22 1300     Visit Number 8    Date for PT Re-Evaluation 05/13/22    PT Start Time 1300    PT Stop Time 5929    PT Time Calculation (min) 45 min    Activity Tolerance Patient tolerated treatment well    Behavior During Therapy [facility] for tasks assessed/performed                Past Medical History:  Diagnosis Date   Arthritis    Diabetes mellitus without complication ([place])    Hyperlipidemia    Hypertension    Murmur 10/13/2018   Past Surgical History:  Procedure Laterality Date   ABDOMINAL HYSTERECTOMY     pt. 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   TOTAL KNEE ARTHROPLASTY  06/20/2012   Procedure: TOTAL KNEE ARTHROPLASTY;  Surgeon: Mauri Pole, MD;  Location: [facility];  Service: Orthopedics;  Laterality: Right;   Patient Active Problem List   Diagnosis Date Noted   Nonrheumatic tricuspid valve regurgitation 10/14/2018   Controlled type 2 diabetes mellitus ([place]) 10/14/2018   Murmur 10/13/2018   Essential hypertension 10/11/2018   Nonrheumatic mitral valve regurgitation 10/11/2018   Hyperlipidemia 10/11/2018   Chest pain 10/11/2018   Hyponatremia 06/22/2012   Expected blood loss anemia 06/21/2012   Overweight (BMI 25.0-29.9) 06/21/2012   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    PCP: Eldridge Abrahams  REFERRING PROVIDER: Madalyn Rob  REFERRING DIAG: W44.628M, W19.XXXA  THERAPY DIAG:  Acute pain of left shoulder  Muscle weakness (generalized)  Stiffness of left shoulder, not elsewhere classified  Rationale for Evaluation and Treatment Rehabilitation  ONSET DATE: 01/01/22  SUBJECTIVE:                                                                                                                                                                                      SUBJECTIVE  STATEMENT: Not mush pain, some L shoulder soreness  PERTINENT HISTORY: Hysterectomy  TKA 06/20/2012 DM  PAIN:  Are you having pain? No  PRECAUTIONS: Fall  WEIGHT BEARING RESTRICTIONS No  FALLS:  Has patient fallen in last 6 months? Yes. Number of falls 1  LIVING ENVIRONMENT: Lives with: lives with their family Lives in: House/apartment Stairs: Yes: Internal: 16 steps; does not go upstairs Has following equipment at home: Single point cane but does not use   OCCUPATION: Stays at home, retired  PLOF: Independent with household mobility without device  PATIENT GOALS to not have  pain  OBJECTIVE:   DIAGNOSTIC FINDINGS:  EXAM: LEFT SHOULDER - 2+ VIEW   COMPARISON:  Left shoulder radiographs 01/01/2022 (earlier same day).   FINDINGS: Interval reduction of the prior anterior shoulder dislocation. The humeral head is now normally located with respect to the glenoid fossa. No definite acute fracture is seen. Mild inferior acromioclavicular joint space narrowing is again seen. There are degenerative endplate osteophytes of the thoracic spine.   IMPRESSION: Interval reduction of left shoulder.      SENSATION: WFL  POSTURE: Forward head, rounded shoulders  UPPER EXTREMITY ROM: seated ROM, R WFL  Active ROM Right eval Left eval Left 04/08/22  Shoulder flexion  110 w/pain 140 w/  pain  Shoulder extension     Shoulder abduction  54 w/pain 95 w/ pain  Shoulder adduction     Shoulder internal rotation  WFL but pain   Shoulder external rotation  47 w/pain 75  Elbow flexion     Elbow extension     Wrist flexion     Wrist extension     Wrist ulnar deviation     Wrist radial deviation     Wrist pronation     Wrist supination     (Blank rows = not tested)  UPPER EXTREMITY MMT:  MMT Right eval Left eval  Shoulder flexion 4 2- w/pain  Shoulder extension    Shoulder abduction 4 2- w/pain  Shoulder adduction    Shoulder internal rotation 4 3  Shoulder external  rotation 4 3  Middle trapezius    Lower trapezius    Elbow flexion 4 4  Elbow extension 4 4  Wrist flexion    Wrist extension    Wrist ulnar deviation    Wrist radial deviation    Wrist pronation    Wrist supination    Grip strength (lbs)    (Blank rows = not tested)  SHOULDER SPECIAL TESTS:  Impingement tests: Painful arc test: positive   Rotator cuff assessment: Drop arm test: positive  and Empty can test: positive    JOINT MOBILITY TESTING:  Increased muscle guarding with PROM   PALPATION:  Atrophy of L scapular muscles, not TTP   TODAY'S TREATMENT:  04/15/22 UBE L1 x 3 each  AAROM 3lb WaTE flex, Ext, IR x10 each Chest press 3lb WaTE 2x10 ER yellow 2x10 Shoulder Flex 1lb 2x10 Shoulder Abd  x5 x10 Rows green 2x15 Horiz Abd yellow 2x10 OHP 1lb WaTE x10   04/08/22 UBE L1 x 3 min each  AROM AAROM 3lb WaTE flex, Ext, IR x10 each Chest press 3lb WaTE 2x5  Shoulder Ext yellow 2x10 Rows yellow 2x10  ER yellow 2x10  LUE PROM in all directions    03/26/22 UBE L1.5 x 3 min each AAROM 3lb WaTE flex, Ext, IR x10 each Rows red 2x10 Shoulder Ext yellow 2x10 Shoulder ER yellow 2x10  Shoulder flex 1lb 2x10  Shoulder Abd 2x10 Triceps Ext LUE yellow 2x10   03/17/22 UBE L2 x44mns  PROM all ways AAROM 2# WaTE 2x10 SA punches 2# 2x10 Cable rows 5# 2x10 Shoulder ext 5# 2x10  1# scaption 2x10 1# lateral raise 2x10    03/02/22 NuStep L4 x 6 min PROM L shoulder, most pain with abduction  AAROM shoulder flexion/extension/IR with 1# WaTE 2x10 1# IR/ER x10 Rows with red TB 2x10 ER with red TB 2x10 L shoulder abduction AROM 2x10     PATIENT EDUCATION: Education details: POC Person educated: Patient Education method: EDance movement psychotherapist  comprehension: verbalized understanding and returned demonstration   HOME EXERCISE PROGRAM: [id number]  ASSESSMENT:  CLINICAL IMPRESSION: Continued working L shoulder strengthening with low weights as  tolerated. Added mote strengthening interventions to session. Pt has the most difficulty with with abduction experiencing pain, weakness, and limited ROM. Postural cues required with seated rows and standing shoulder extensions. Increase resistance tolerated with rows.   OBJECTIVE IMPAIRMENTS Abnormal gait, decreased balance, difficulty walking, decreased ROM, decreased strength, decreased safety awareness, impaired UE functional use, and pain.   ACTIVITY LIMITATIONS carrying, lifting, reach over head, and locomotion level  PARTICIPATION LIMITATIONS: cleaning, laundry, and yard work  PERSONAL FACTORS Age and 1-2 comorbidities: arthritis, DM, HTN  are also affecting patient's functional outcome.   REHAB POTENTIAL: Good  CLINICAL DECISION MAKING: Stable/uncomplicated  EVALUATION COMPLEXITY: Low   GOALS: Goals reviewed with patient? Yes  SHORT TERM GOALS: Target date: 03/03/22  Patient will be independent with initial HEP.  Goal status: met   LONG TERM GOALS: Target date: 05/13/22  1.  Patient will report 75% improvement in L shoulder pain to improve QOL.  Goal status: progressing  2.   Patient to improve L shoulder AROM to [facility] without pain provocation to allow for increased ease of ADLs.  Baseline: flexion 110, abd 54, ER 47 all with pain Goal status: progressing  3.  Patient will demonstrate improved functional UE strength as demonstrated by >= 4/5 in L shoulder. Goal status: INITIAL  4.  Patient will report 10 on FOTO (patient outcome measure)  to demonstrate improved functional ability.  Goal status: INITIAL   PLAN: PT FREQUENCY: 2x/week  PT DURATION: 8 weeks  PLANNED INTERVENTIONS: Therapeutic exercises, Therapeutic activity, Neuromuscular re-education, Balance training, Gait training, Patient/Family education, Joint mobilization, Electrical stimulation, Cryotherapy, Moist heat, Ionotophoresis 1m/ml Dexamethasone, and Manual therapy  PLAN FOR NEXT SESSION: Review  HEP, L shoulder/scapular strengthening and ALequita Asal PTA 04/15/2022, 1:02 PM

## 2022-04-22 ENCOUNTER — Ambulatory Visit: Payer: Medicare Other | Admitting: Physical Therapy

## 2022-04-22 ENCOUNTER — Encounter: Payer: Self-pay | Admitting: Physical Therapy

## 2022-04-22 DIAGNOSIS — M6281 Muscle weakness (generalized): Secondary | ICD-10-CM

## 2022-04-22 DIAGNOSIS — M25612 Stiffness of left shoulder, not elsewhere classified: Secondary | ICD-10-CM

## 2022-04-22 DIAGNOSIS — M25512 Pain in left shoulder: Secondary | ICD-10-CM | POA: Diagnosis not present

## 2022-04-22 NOTE — Therapy (Signed)
OUTPATIENT PHYSICAL THERAPY SHOULDER TREATMENT   Patient Name: Alexandra Bean MRN: [id number] DOB:[birth year], 81 y.o., female Today's Date: 04/22/2022   PT End of Session - 04/22/22 1304     Visit Number 9    Date for PT Re-Evaluation 05/13/22    PT Start Time 1300    PT Stop Time 6712    PT Time Calculation (min) 45 min    Activity Tolerance Patient tolerated treatment well    Behavior During Therapy [facility] for tasks assessed/performed                Past Medical History:  Diagnosis Date   Arthritis    Diabetes mellitus without complication ([place])    Hyperlipidemia    Hypertension    Murmur 10/13/2018   Past Surgical History:  Procedure Laterality Date   ABDOMINAL HYSTERECTOMY     pt. 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   TOTAL KNEE ARTHROPLASTY  06/20/2012   Procedure: TOTAL KNEE ARTHROPLASTY;  Surgeon: Mauri Pole, MD;  Location: [facility];  Service: Orthopedics;  Laterality: Right;   Patient Active Problem List   Diagnosis Date Noted   Nonrheumatic tricuspid valve regurgitation 10/14/2018   Controlled type 2 diabetes mellitus ([place]) 10/14/2018   Murmur 10/13/2018   Essential hypertension 10/11/2018   Nonrheumatic mitral valve regurgitation 10/11/2018   Hyperlipidemia 10/11/2018   Chest pain 10/11/2018   Hyponatremia 06/22/2012   Expected blood loss anemia 06/21/2012   Overweight (BMI 25.0-29.9) 06/21/2012   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    PCP: Eldridge Abrahams  REFERRING PROVIDER: Madalyn Rob  REFERRING DIAG: W58.099I, W19.XXXA  THERAPY DIAG:  Acute pain of left shoulder  Muscle weakness (generalized)  Stiffness of left shoulder, not elsewhere classified  Rationale for Evaluation and Treatment Rehabilitation  ONSET DATE: 01/01/22  SUBJECTIVE:                                                                                                                                                                                      SUBJECTIVE  STATEMENT: Doing ok, stil feels the pain  PERTINENT HISTORY: Hysterectomy  TKA 06/20/2012 DM  PAIN:  Are you having pain? 2/10 in L shoulder  PRECAUTIONS: Fall  WEIGHT BEARING RESTRICTIONS No  FALLS:  Has patient fallen in last 6 months? Yes. Number of falls 1  LIVING ENVIRONMENT: Lives with: lives with their family Lives in: House/apartment Stairs: Yes: Internal: 16 steps; does not go upstairs Has following equipment at home: Single point cane but does not use   OCCUPATION: Stays at home, retired  PLOF: Independent with household mobility without device  PATIENT GOALS to  not have pain  OBJECTIVE:   DIAGNOSTIC FINDINGS:  EXAM: LEFT SHOULDER - 2+ VIEW   COMPARISON:  Left shoulder radiographs 01/01/2022 (earlier same day).   FINDINGS: Interval reduction of the prior anterior shoulder dislocation. The humeral head is now normally located with respect to the glenoid fossa. No definite acute fracture is seen. Mild inferior acromioclavicular joint space narrowing is again seen. There are degenerative endplate osteophytes of the thoracic spine.   IMPRESSION: Interval reduction of left shoulder.      SENSATION: WFL  POSTURE: Forward head, rounded shoulders  UPPER EXTREMITY ROM: seated ROM, R WFL  Active ROM Right eval Left eval Left 04/08/22  Shoulder flexion  110 w/pain 140 w/  pain  Shoulder extension     Shoulder abduction  54 w/pain 95 w/ pain  Shoulder adduction     Shoulder internal rotation  WFL but pain   Shoulder external rotation  47 w/pain 75  Elbow flexion     Elbow extension     Wrist flexion     Wrist extension     Wrist ulnar deviation     Wrist radial deviation     Wrist pronation     Wrist supination     (Blank rows = not tested)  UPPER EXTREMITY MMT:  MMT Right eval Left eval  Shoulder flexion 4 2- w/pain  Shoulder extension    Shoulder abduction 4 2- w/pain  Shoulder adduction    Shoulder internal rotation 4 3  Shoulder  external rotation 4 3  Middle trapezius    Lower trapezius    Elbow flexion 4 4  Elbow extension 4 4  Wrist flexion    Wrist extension    Wrist ulnar deviation    Wrist radial deviation    Wrist pronation    Wrist supination    Grip strength (lbs)    (Blank rows = not tested)  SHOULDER SPECIAL TESTS:  Impingement tests: Painful arc test: positive   Rotator cuff assessment: Drop arm test: positive  and Empty can test: positive    JOINT MOBILITY TESTING:  Increased muscle guarding with PROM   PALPATION:  Atrophy of L scapular muscles, not TTP   TODAY'S TREATMENT:  04/22/22 UBE L1.5 x3 each  Rows 10lb Lats 15lb 2x10 Shoulder Flex 2lb 2x10 Shoulder Abd 2x10 Shoulder ER red 2x10  LUE PROM all directions  04/15/22 UBE L1 x 3 each  AAROM 3lb WaTE flex, Ext, IR x10 each Chest press 3lb WaTE 2x10 ER yellow 2x10 Shoulder Flex 1lb 2x10 Shoulder Abd  x5 x10 Rows green 2x15 Horiz Abd yellow 2x10 OHP 1lb WaTE x10   04/08/22 UBE L1 x 3 min each  AROM AAROM 3lb WaTE flex, Ext, IR x10 each Chest press 3lb WaTE 2x5  Shoulder Ext yellow 2x10 Rows yellow 2x10  ER yellow 2x10  LUE PROM in all directions    03/26/22 UBE L1.5 x 3 min each AAROM 3lb WaTE flex, Ext, IR x10 each Rows red 2x10 Shoulder Ext yellow 2x10 Shoulder ER yellow 2x10  Shoulder flex 1lb 2x10  Shoulder Abd 2x10 Triceps Ext LUE yellow 2x10   PATIENT EDUCATION: Education details: POC Person educated: Patient Education method: Customer service manager Education comprehension: verbalized understanding and returned demonstration   HOME EXERCISE PROGRAM: [id number]  ASSESSMENT:  CLINICAL IMPRESSION: Continued working L shoulder strengthening with low weights as tolerated. Added some machine level interventions without issues. LUE remains weak with shoulder abduction. Some end range tightness with PROM moe so  with abduction.  Some end range pain with PROM      OBJECTIVE IMPAIRMENTS Abnormal  gait, decreased balance, difficulty walking, decreased ROM, decreased strength, decreased safety awareness, impaired UE functional use, and pain.   ACTIVITY LIMITATIONS carrying, lifting, reach over head, and locomotion level  PARTICIPATION LIMITATIONS: cleaning, laundry, and yard work  PERSONAL FACTORS Age and 1-2 comorbidities: arthritis, DM, HTN  are also affecting patient's functional outcome.   REHAB POTENTIAL: Good  CLINICAL DECISION MAKING: Stable/uncomplicated  EVALUATION COMPLEXITY: Low   GOALS: Goals reviewed with patient? Yes  SHORT TERM GOALS: Target date: 03/03/22  Patient will be independent with initial HEP.  Goal status: met   LONG TERM GOALS: Target date: 05/13/22  1.  Patient will report 75% improvement in L shoulder pain to improve QOL.  Goal status: progressing  2.   Patient to improve L shoulder AROM to [facility] without pain provocation to allow for increased ease of ADLs.  Baseline: flexion 110, abd 54, ER 47 all with pain Goal status: progressing  3.  Patient will demonstrate improved functional UE strength as demonstrated by >= 4/5 in L shoulder. Goal status: INITIAL  4.  Patient will report 52 on FOTO (patient outcome measure)  to demonstrate improved functional ability.  Goal status: INITIAL   PLAN: PT FREQUENCY: 2x/week  PT DURATION: 8 weeks  PLANNED INTERVENTIONS: Therapeutic exercises, Therapeutic activity, Neuromuscular re-education, Balance training, Gait training, Patient/Family education, Joint mobilization, Electrical stimulation, Cryotherapy, Moist heat, Ionotophoresis 47m/ml Dexamethasone, and Manual therapy  PLAN FOR NEXT SESSION: Review HEP, L shoulder/scapular strengthening and ALequita Asal PTA 04/22/2022, 1:05 PM

## 2022-04-28 ENCOUNTER — Ambulatory Visit: Payer: Medicare Other | Attending: Emergency Medicine | Admitting: Physical Therapy

## 2022-04-28 ENCOUNTER — Encounter: Payer: Self-pay | Admitting: Physical Therapy

## 2022-04-28 DIAGNOSIS — M25612 Stiffness of left shoulder, not elsewhere classified: Secondary | ICD-10-CM | POA: Diagnosis present

## 2022-04-28 DIAGNOSIS — M6281 Muscle weakness (generalized): Secondary | ICD-10-CM | POA: Diagnosis present

## 2022-04-28 DIAGNOSIS — M25512 Pain in left shoulder: Secondary | ICD-10-CM | POA: Insufficient documentation

## 2022-04-28 NOTE — Therapy (Signed)
OUTPATIENT PHYSICAL THERAPY SHOULDER TREATMENT   Patient Name: Alexandra Bean MRN: [id number] DOB:06-10-1941, 81 y.o., female Today's Date: 04/28/2022   PT End of Session - 04/28/22 1258     Visit Number 10    Date for PT Re-Evaluation 05/13/22    PT Start Time 1300    PT Stop Time 4628    PT Time Calculation (min) 45 min    Activity Tolerance Patient tolerated treatment well    Behavior During Therapy [facility] for tasks assessed/performed            Progress Note Reporting Period 02/03/22 to 04/28/22  See note below for Objective Data and Assessment of Progress/Goals.         Past Medical History:  Diagnosis Date   Arthritis    Diabetes mellitus without complication ([place])    Hyperlipidemia    Hypertension    Murmur 10/13/2018   Past Surgical History:  Procedure Laterality Date   ABDOMINAL HYSTERECTOMY     pt. 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   TOTAL KNEE ARTHROPLASTY  06/20/2012   Procedure: TOTAL KNEE ARTHROPLASTY;  Surgeon: Mauri Pole, MD;  Location: [facility];  Service: Orthopedics;  Laterality: Right;   Patient Active Problem List   Diagnosis Date Noted   Nonrheumatic tricuspid valve regurgitation 10/14/2018   Controlled type 2 diabetes mellitus ([place]) 10/14/2018   Murmur 10/13/2018   Essential hypertension 10/11/2018   Nonrheumatic mitral valve regurgitation 10/11/2018   Hyperlipidemia 10/11/2018   Chest pain 10/11/2018   Hyponatremia 06/22/2012   Expected blood loss anemia 06/21/2012   Overweight (BMI 25.0-29.9) 06/21/2012   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    PCP: Eldridge Abrahams  REFERRING PROVIDER: Madalyn Rob  REFERRING DIAG: M38.177N, W19.XXXA  THERAPY DIAG:  Acute pain of left shoulder  Muscle weakness (generalized)  Stiffness of left shoulder, not elsewhere classified  Rationale for Evaluation and Treatment Rehabilitation  ONSET DATE: 01/01/22  SUBJECTIVE:                                                                                                                                                                                       SUBJECTIVE STATEMENT: "Good" L one bother her still, too much activity causes pain  PERTINENT HISTORY: Hysterectomy  TKA 06/20/2012 DM  PAIN:  Are you having pain? 0/10 in L shoulder  PRECAUTIONS: Fall  WEIGHT BEARING RESTRICTIONS No  FALLS:  Has patient fallen in last 6 months? Yes. Number of falls 1  LIVING ENVIRONMENT: Lives with: lives with their family Lives in: House/apartment Stairs: Yes: Internal: 16 steps; does not go upstairs Has following equipment at  home: Single point cane but does not use   OCCUPATION: Stays at home, retired  PLOF: Independent with household mobility without device  PATIENT GOALS to not have pain  OBJECTIVE:   DIAGNOSTIC FINDINGS:  EXAM: LEFT SHOULDER - 2+ VIEW   COMPARISON:  Left shoulder radiographs 01/01/2022 (earlier same day).   FINDINGS: Interval reduction of the prior anterior shoulder dislocation. The humeral head is now normally located with respect to the glenoid fossa. No definite acute fracture is seen. Mild inferior acromioclavicular joint space narrowing is again seen. There are degenerative endplate osteophytes of the thoracic spine.   IMPRESSION: Interval reduction of left shoulder.      SENSATION: WFL  POSTURE: Forward head, rounded shoulders  UPPER EXTREMITY ROM: seated ROM, R WFL  Active ROM Right eval Left eval Left 04/08/22 L 04/28/22   Pain with all   Shoulder flexion  110 w/pain 140 w/  pain 155  Shoulder extension      Shoulder abduction  54 w/pain 95 w/ pain 119  Shoulder adduction      Shoulder internal rotation  WFL but pain  WFL  Shoulder external rotation  47 w/pain 75 72  Elbow flexion      Elbow extension      Wrist flexion      Wrist extension      Wrist ulnar deviation      Wrist radial deviation      Wrist pronation      Wrist supination      (Blank rows = not  tested)  UPPER EXTREMITY MMT:  MMT Right eval Left eval Left  04/28/22  Shoulder flexion 4 2- w/pain 4-  Shoulder extension     Shoulder abduction 4 2- w/pain 4-  Shoulder adduction     Shoulder internal rotation $RemoveBeforeDEI'4 3 4  '[id number]$ Shoulder external rotation 4 3 4+  Middle trapezius     Lower trapezius     Elbow flexion 4 4   Elbow extension 4 4   Wrist flexion     Wrist extension     Wrist ulnar deviation     Wrist radial deviation     Wrist pronation     Wrist supination     Grip strength (lbs)     (Blank rows = not tested)  SHOULDER SPECIAL TESTS:  Impingement tests: Painful arc test: positive   Rotator cuff assessment: Drop arm test: positive  and Empty can test: positive    JOINT MOBILITY TESTING:  Increased muscle guarding with PROM   PALPATION:  Atrophy of L scapular muscles, not TTP   TODAY'S TREATMENT:  04/28/22 UBE L1.7 x3 min each  LUE fex, CW, and CCW 1lb again wall with pillow case  Rows 10lb Lats 15lb 2x10 Shoulder Ext red 2x10  Shoulder flex 2lb 2x10  Shoulder abs AROM 2x10    04/22/22 UBE L1.5 x3 each  Rows 10lb Lats 15lb 2x10 Shoulder Flex 2lb 2x10 Shoulder Abd 2x10 Shoulder ER red 2x10  LUE PROM all directions  04/15/22 UBE L1 x 3 each  AAROM 3lb WaTE flex, Ext, IR x10 each Chest press 3lb WaTE 2x10 ER yellow 2x10 Shoulder Flex 1lb 2x10 Shoulder Abd  x5 x10 Rows green 2x15 Horiz Abd yellow 2x10 OHP 1lb WaTE x10   04/08/22 UBE L1 x 3 min each  AROM AAROM 3lb WaTE flex, Ext, IR x10 each Chest press 3lb WaTE 2x5  Shoulder Ext yellow 2x10 Rows yellow 2x10  ER yellow 2x10  LUE PROM  in all directions    03/26/22 UBE L1.5 x 3 min each AAROM 3lb WaTE flex, Ext, IR x10 each Rows red 2x10 Shoulder Ext yellow 2x10 Shoulder ER yellow 2x10  Shoulder flex 1lb 2x10  Shoulder Abd 2x10 Triceps Ext LUE yellow 2x10   PATIENT EDUCATION: Education details: POC Person educated: Patient Education method: Customer service manager Education  comprehension: verbalized understanding and returned demonstration   HOME EXERCISE PROGRAM: [id number]  ASSESSMENT:  CLINICAL IMPRESSION: Continued working L shoulder strengthening with low weights as tolerated. Pt has progressed increasing her L shoulder ROM and strength despite having some pain with motions. Good effort given throughout session. Tactile cue to prevent trunk sway with with seated rows.     OBJECTIVE IMPAIRMENTS Abnormal gait, decreased balance, difficulty walking, decreased ROM, decreased strength, decreased safety awareness, impaired UE functional use, and pain.   ACTIVITY LIMITATIONS carrying, lifting, reach over head, and locomotion level  PARTICIPATION LIMITATIONS: cleaning, laundry, and yard work  PERSONAL FACTORS Age and 1-2 comorbidities: arthritis, DM, HTN  are also affecting patient's functional outcome.   REHAB POTENTIAL: Good  CLINICAL DECISION MAKING: Stable/uncomplicated  EVALUATION COMPLEXITY: Low   GOALS: Goals reviewed with patient? Yes  SHORT TERM GOALS: Target date: 03/03/22  Patient will be independent with initial HEP.  Goal status: met   LONG TERM GOALS: Target date: 05/13/22  1.  Patient will report 75% improvement in L shoulder pain to improve QOL.  Goal status: progressing  2.   Patient to improve L shoulder AROM to [facility] without pain provocation to allow for increased ease of ADLs.  Baseline: flexion 110, abd 54, ER 47 all with pain Goal status: progressing  3.  Patient will demonstrate improved functional UE strength as demonstrated by >= 4/5 in L shoulder. Goal status: partly met  4.  Patient will report 60 on FOTO (patient outcome measure)  to demonstrate improved functional ability.  Goal status: progressing   PLAN: PT FREQUENCY: 2x/week  PT DURATION: 8 weeks  PLANNED INTERVENTIONS: Therapeutic exercises, Therapeutic activity, Neuromuscular re-education, Balance training, Gait training, Patient/Family education, Joint  mobilization, Electrical stimulation, Cryotherapy, Moist heat, Ionotophoresis 4mg /ml Dexamethasone, and Manual therapy  PLAN FOR NEXT SESSION: Review HEP, L shoulder/scapular strengthening and [place], DPT Scot Jun, PTA 04/28/2022, 12:59 PM

## 2022-04-29 ENCOUNTER — Encounter: Payer: Medicare Other | Admitting: Physical Therapy

## 2022-05-04 NOTE — Therapy (Signed)
OUTPATIENT PHYSICAL THERAPY SHOULDER TREATMENT   Patient Name: Alexandra Bean MRN: [id number] DOB:01/19/1941, 81 y.o., female Today's Date: 05/05/2022   PT End of Session - 05/05/22 1315     Visit Number 11    Date for PT Re-Evaluation 05/13/22    PT Start Time 1315    PT Stop Time 1400    PT Time Calculation (min) 45 min    Activity Tolerance Patient tolerated treatment well    Behavior During Therapy [facility] for tasks assessed/performed             Past Medical History:  Diagnosis Date   Arthritis    Diabetes mellitus without complication ([place])    Hyperlipidemia    Hypertension    Murmur 10/13/2018   Past Surgical History:  Procedure Laterality Date   ABDOMINAL HYSTERECTOMY     pt. 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   TOTAL KNEE ARTHROPLASTY  06/20/2012   Procedure: TOTAL KNEE ARTHROPLASTY;  Surgeon: Mauri Pole, MD;  Location: [facility];  Service: Orthopedics;  Laterality: Right;   Patient Active Problem List   Diagnosis Date Noted   Nonrheumatic tricuspid valve regurgitation 10/14/2018   Controlled type 2 diabetes mellitus ([place]) 10/14/2018   Murmur 10/13/2018   Essential hypertension 10/11/2018   Nonrheumatic mitral valve regurgitation 10/11/2018   Hyperlipidemia 10/11/2018   Chest pain 10/11/2018   Hyponatremia 06/22/2012   Expected blood loss anemia 06/21/2012   Overweight (BMI 25.0-29.9) 06/21/2012   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    PCP: Eldridge Abrahams  REFERRING PROVIDER: Madalyn Rob  REFERRING DIAG: G26.948N, W19.XXXA  THERAPY DIAG:  Acute pain of left shoulder  Muscle weakness (generalized)  Stiffness of left shoulder, not elsewhere classified  Rationale for Evaluation and Treatment Rehabilitation  ONSET DATE: 01/01/22  SUBJECTIVE:                                                                                                                                                                                      SUBJECTIVE  STATEMENT: It's better but achy a 1/10.   PERTINENT HISTORY: Hysterectomy  TKA 06/20/2012 DM  PAIN:  Are you having pain? 0/10 in L shoulder  PRECAUTIONS: Fall  WEIGHT BEARING RESTRICTIONS No  FALLS:  Has patient fallen in last 6 months? Yes. Number of falls 1  LIVING ENVIRONMENT: Lives with: lives with their family Lives in: House/apartment Stairs: Yes: Internal: 16 steps; does not go upstairs Has following equipment at home: Single point cane but does not use   OCCUPATION: Stays at home, retired  PLOF: Independent with household mobility without device  PATIENT GOALS to not have  pain  OBJECTIVE:   DIAGNOSTIC FINDINGS:  EXAM: LEFT SHOULDER - 2+ VIEW   COMPARISON:  Left shoulder radiographs 01/01/2022 (earlier same day).   FINDINGS: Interval reduction of the prior anterior shoulder dislocation. The humeral head is now normally located with respect to the glenoid fossa. No definite acute fracture is seen. Mild inferior acromioclavicular joint space narrowing is again seen. There are degenerative endplate osteophytes of the thoracic spine.   IMPRESSION: Interval reduction of left shoulder.      SENSATION: WFL  POSTURE: Forward head, rounded shoulders  UPPER EXTREMITY ROM: seated ROM, R WFL  Active ROM Right eval Left eval Left 04/08/22 L 04/28/22   Pain with all   Shoulder flexion  110 w/pain 140 w/  pain 155  Shoulder extension      Shoulder abduction  54 w/pain 95 w/ pain 119  Shoulder adduction      Shoulder internal rotation  WFL but pain  WFL  Shoulder external rotation  47 w/pain 75 72  Elbow flexion      Elbow extension      Wrist flexion      Wrist extension      Wrist ulnar deviation      Wrist radial deviation      Wrist pronation      Wrist supination      (Blank rows = not tested)  UPPER EXTREMITY MMT:  MMT Right eval Left eval Left  04/28/22  Shoulder flexion 4 2- w/pain 4-  Shoulder extension     Shoulder abduction 4 2- w/pain  4-  Shoulder adduction     Shoulder internal rotation $RemoveBeforeDEI'4 3 4  '[id number]$ Shoulder external rotation 4 3 4+  Middle trapezius     Lower trapezius     Elbow flexion 4 4   Elbow extension 4 4   Wrist flexion     Wrist extension     Wrist ulnar deviation     Wrist radial deviation     Wrist pronation     Wrist supination     Grip strength (lbs)     (Blank rows = not tested)  SHOULDER SPECIAL TESTS:  Impingement tests: Painful arc test: positive   Rotator cuff assessment: Drop arm test: positive  and Empty can test: positive    JOINT MOBILITY TESTING:  Increased muscle guarding with PROM   PALPATION:  Atrophy of L scapular muscles, not TTP   TODAY'S TREATMENT:  05/05/22 UBE L2 x48mins  [place] raises 1# 2x10 Shoulder flex 1# 2x10 Lateral raises 1# 2x10 Rows and lats 15# 2x10 Shoulder ext greenTB 2x10 Shoulder ER/IR redTB 2x10   04/28/22 UBE L1.7 x3 min each  LUE fex, CW, and CCW 1lb again wall with pillow case  Rows 10lb Lats 15lb 2x10 Shoulder Ext red 2x10  Shoulder flex 2lb 2x10  Shoulder abs AROM 2x10   04/22/22 UBE L1.5 x3 each  Rows 10lb Lats 15lb 2x10 Shoulder Flex 2lb 2x10 Shoulder Abd 2x10 Shoulder ER red 2x10  LUE PROM all directions  04/15/22 UBE L1 x 3 each  AAROM 3lb WaTE flex, Ext, IR x10 each Chest press 3lb WaTE 2x10 ER yellow 2x10 Shoulder Flex 1lb 2x10 Shoulder Abd  x5 x10 Rows green 2x15 Horiz Abd yellow 2x10 OHP 1lb WaTE x10   04/08/22 UBE L1 x 3 min each  AROM AAROM 3lb WaTE flex, Ext, IR x10 each Chest press 3lb WaTE 2x5  Shoulder Ext yellow 2x10 Rows yellow 2x10  ER yellow 2x10  LUE PROM in all directions    03/26/22 UBE L1.5 x 3 min each AAROM 3lb WaTE flex, Ext, IR x10 each Rows red 2x10 Shoulder Ext yellow 2x10 Shoulder ER yellow 2x10  Shoulder flex 1lb 2x10  Shoulder Abd 2x10 Triceps Ext LUE yellow 2x10   PATIENT EDUCATION: Education details: POC Person educated: Patient Education method: Holiday representative Education comprehension: verbalized understanding and returned demonstration   HOME EXERCISE PROGRAM: [id number]  ASSESSMENT:  CLINICAL IMPRESSION: Continued working L shoulder strengthening with low weights as tolerated. Still has pain over 90d with abduction. Good effort given throughout session. Tactile cue required for form.      OBJECTIVE IMPAIRMENTS Abnormal gait, decreased balance, difficulty walking, decreased ROM, decreased strength, decreased safety awareness, impaired UE functional use, and pain.   ACTIVITY LIMITATIONS carrying, lifting, reach over head, and locomotion level  PARTICIPATION LIMITATIONS: cleaning, laundry, and yard work  PERSONAL FACTORS Age and 1-2 comorbidities: arthritis, DM, HTN  are also affecting patient's functional outcome.   REHAB POTENTIAL: Good  CLINICAL DECISION MAKING: Stable/uncomplicated  EVALUATION COMPLEXITY: Low   GOALS: Goals reviewed with patient? Yes  SHORT TERM GOALS: Target date: 03/03/22  Patient will be independent with initial HEP.  Goal status: met   LONG TERM GOALS: Target date: 05/13/22  1.  Patient will report 75% improvement in L shoulder pain to improve QOL.  Goal status: progressing  2.   Patient to improve L shoulder AROM to [facility] without pain provocation to allow for increased ease of ADLs.  Baseline: flexion 110, abd 54, ER 47 all with pain Goal status: progressing  3.  Patient will demonstrate improved functional UE strength as demonstrated by >= 4/5 in L shoulder. Goal status: partly met  4.  Patient will report 61 on FOTO (patient outcome measure)  to demonstrate improved functional ability.  Goal status: progressing   PLAN: PT FREQUENCY: 2x/week  PT DURATION: 8 weeks  PLANNED INTERVENTIONS: Therapeutic exercises, Therapeutic activity, Neuromuscular re-education, Balance training, Gait training, Patient/Family education, Joint mobilization, Electrical stimulation, Cryotherapy, Moist  heat, Ionotophoresis 4mg /ml Dexamethasone, and Manual therapy  PLAN FOR NEXT SESSION: Review HEP, L shoulder/scapular strengthening and AAROM  Andris Baumann, DPT [place], PT 05/05/2022, 1:58 PM

## 2022-05-05 ENCOUNTER — Ambulatory Visit: Payer: Medicare Other

## 2022-05-05 DIAGNOSIS — M25612 Stiffness of left shoulder, not elsewhere classified: Secondary | ICD-10-CM

## 2022-05-05 DIAGNOSIS — M25512 Pain in left shoulder: Secondary | ICD-10-CM

## 2022-05-05 DIAGNOSIS — M6281 Muscle weakness (generalized): Secondary | ICD-10-CM

## 2022-05-12 ENCOUNTER — Ambulatory Visit: Payer: Medicare Other

## 2022-05-12 DIAGNOSIS — M25512 Pain in left shoulder: Secondary | ICD-10-CM

## 2022-05-12 DIAGNOSIS — M6281 Muscle weakness (generalized): Secondary | ICD-10-CM

## 2022-05-12 DIAGNOSIS — M25612 Stiffness of left shoulder, not elsewhere classified: Secondary | ICD-10-CM

## 2022-05-12 NOTE — Therapy (Signed)
OUTPATIENT PHYSICAL THERAPY SHOULDER TREATMENT   Patient Name: Alexandra Bean MRN: [id number] DOB:05-30-1941, 81 y.o., female Today's Date: 05/12/2022   PT End of Session - 05/12/22 1313     Visit Number 12    Date for PT Re-Evaluation 06/23/22    PT Start Time 1315    PT Stop Time 1356    PT Time Calculation (min) 41 min    Activity Tolerance Patient tolerated treatment well    Behavior During Therapy [facility] for tasks assessed/performed              Past Medical History:  Diagnosis Date   Arthritis    Diabetes mellitus without complication ([place])    Hyperlipidemia    Hypertension    Murmur 10/13/2018   Past Surgical History:  Procedure Laterality Date   ABDOMINAL HYSTERECTOMY     pt. 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   TOTAL KNEE ARTHROPLASTY  06/20/2012   Procedure: TOTAL KNEE ARTHROPLASTY;  Surgeon: Mauri Pole, MD;  Location: [facility];  Service: Orthopedics;  Laterality: Right;   Patient Active Problem List   Diagnosis Date Noted   Nonrheumatic tricuspid valve regurgitation 10/14/2018   Controlled type 2 diabetes mellitus ([place]) 10/14/2018   Murmur 10/13/2018   Essential hypertension 10/11/2018   Nonrheumatic mitral valve regurgitation 10/11/2018   Hyperlipidemia 10/11/2018   Chest pain 10/11/2018   Hyponatremia 06/22/2012   Expected blood loss anemia 06/21/2012   Overweight (BMI 25.0-29.9) 06/21/2012   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    PCP: Eldridge Abrahams  REFERRING PROVIDER: Madalyn Rob  REFERRING DIAG: V49.449Q, W19.XXXA  THERAPY DIAG:  Acute pain of left shoulder  Muscle weakness (generalized)  Stiffness of left shoulder, not elsewhere classified  Rationale for Evaluation and Treatment Rehabilitation  ONSET DATE: 01/01/22  SUBJECTIVE:                                                                                                                                                                                      SUBJECTIVE  STATEMENT: Not too much pain but lifting overhead is still hard.  PERTINENT HISTORY: Hysterectomy  TKA 06/20/2012 DM  PAIN:  Are you having pain? 0/10 in L shoulder  PRECAUTIONS: Fall  WEIGHT BEARING RESTRICTIONS No  FALLS:  Has patient fallen in last 6 months? Yes. Number of falls 1  LIVING ENVIRONMENT: Lives with: lives with their family Lives in: House/apartment Stairs: Yes: Internal: 16 steps; does not go upstairs Has following equipment at home: Single point cane but does not use   OCCUPATION: Stays at home, retired  PLOF: Independent with household mobility without device  PATIENT  GOALS to not have pain  OBJECTIVE:   DIAGNOSTIC FINDINGS:  EXAM: LEFT SHOULDER - 2+ VIEW   COMPARISON:  Left shoulder radiographs 01/01/2022 (earlier same day).   FINDINGS: Interval reduction of the prior anterior shoulder dislocation. The humeral head is now normally located with respect to the glenoid fossa. No definite acute fracture is seen. Mild inferior acromioclavicular joint space narrowing is again seen. There are degenerative endplate osteophytes of the thoracic spine.   IMPRESSION: Interval reduction of left shoulder.      SENSATION: WFL  POSTURE: Forward head, rounded shoulders  UPPER EXTREMITY ROM: seated ROM, R WFL  Active ROM Right eval Left eval Left 04/08/22 L 04/28/22   Pain with all  Left 05/12/22  Shoulder flexion  110 w/pain 140 w/  pain 155 160  Shoulder extension       Shoulder abduction  54 w/pain 95 w/ pain 119 115  Shoulder adduction       Shoulder internal rotation  WFL but pain  WFL   Shoulder external rotation  47 w/pain 75 72 76  Elbow flexion       Elbow extension       Wrist flexion       Wrist extension       Wrist ulnar deviation       Wrist radial deviation       Wrist pronation       Wrist supination       (Blank rows = not tested)  UPPER EXTREMITY MMT:  MMT Right eval Left eval Left  04/28/22 Left 05/12/22  Shoulder  flexion 4 2- w/pain 4- 4-  Shoulder extension      Shoulder abduction 4 2- w/pain 4- 4-  Shoulder adduction      Shoulder internal rotation _0 Shoulder external rotation 4 3 4+ 4  Middle trapezius      Lower trapezius      Elbow flexion 4 4    Elbow extension 4 4    Wrist flexion      Wrist extension      Wrist ulnar deviation      Wrist radial deviation      Wrist pronation      Wrist supination      Grip strength (lbs)      (Blank rows = not tested)  SHOULDER SPECIAL TESTS:  Impingement tests: Painful arc test: positive   Rotator cuff assessment: Drop arm test: positive  and Empty can test: positive    JOINT MOBILITY TESTING:  Increased muscle guarding with PROM   PALPATION:  Atrophy of L scapular muscles, not TTP   TODAY'S TREATMENT:  05/12/22 Recheck ROM  Recheck MMT UBE L3 x69mns  OHP red ball 2x10  Horizontal abd redTB 2x10  Yellow 3 way shoulder 2x5  ER redTB 2x10  Lateral raise 1# 2x10 Shoulder flexion 2# WaTe 2x10 Shoulder ext greenTB 2x10  Scaption raises 1# x10, x5 due to fatigue  05/05/22 UBE L2 x654ms  Scaption raises 1# 2x10 Shoulder flex 1# 2x10 Lateral raises 1# 2x10 Rows and lats 15# 2x10 Shoulder ext greenTB 2x10 Shoulder ER/IR redTB 2x10   04/28/22 UBE L1.7 x3 min each  LUE fex, CW, and CCW 1lb again wall with pillow case  Rows 10lb Lats 15lb 2x10 Shoulder Ext red 2x10  Shoulder flex 2lb 2x10  Shoulder abs AROM 2x10   04/22/22 UBE L1.5 x3 each  Rows 10lb Lats 15lb 2x10 Shoulder Flex 2lb 2x10 Shoulder  Abd 2x10 Shoulder ER red 2x10  LUE PROM all directions  04/15/22 UBE L1 x 3 each  AAROM 3lb WaTE flex, Ext, IR x10 each Chest press 3lb WaTE 2x10 ER yellow 2x10 Shoulder Flex 1lb 2x10 Shoulder Abd  x5 x10 Rows green 2x15 Horiz Abd yellow 2x10 OHP 1lb WaTE x10   04/08/22 UBE L1 x 3 min each  AROM AAROM 3lb WaTE flex, Ext, IR x10 each Chest press 3lb WaTE 2x5  Shoulder Ext yellow 2x10 Rows yellow 2x10  ER yellow 2x10   LUE PROM in all directions    03/26/22 UBE L1.5 x 3 min each AAROM 3lb WaTE flex, Ext, IR x10 each Rows red 2x10 Shoulder Ext yellow 2x10 Shoulder ER yellow 2x10  Shoulder flex 1lb 2x10  Shoulder Abd 2x10 Triceps Ext LUE yellow 2x10   PATIENT EDUCATION: Education details: POC Person educated: Patient Education method: Customer service manager Education comprehension: verbalized understanding and returned demonstration   HOME EXERCISE PROGRAM: [id number]  ASSESSMENT:  CLINICAL IMPRESSION: Rechecked strength and ROM, she continues to have deficits in both but has made good improvements overall. Still has difficulty with overhead movements. We continued working L shoulder strengthening with low weights as tolerated. Good effort given throughout session, fatigues with 1# scaption raises. Tactile cue required for form.      OBJECTIVE IMPAIRMENTS Abnormal gait, decreased balance, difficulty walking, decreased ROM, decreased strength, decreased safety awareness, impaired UE functional use, and pain.   ACTIVITY LIMITATIONS carrying, lifting, reach over head, and locomotion level  PARTICIPATION LIMITATIONS: cleaning, laundry, and yard work  PERSONAL FACTORS Age and 1-2 comorbidities: arthritis, DM, HTN  are also affecting patient's functional outcome.   REHAB POTENTIAL: Good  CLINICAL DECISION MAKING: Stable/uncomplicated  EVALUATION COMPLEXITY: Low   GOALS: Goals reviewed with patient? Yes  SHORT TERM GOALS: Target date: 03/03/22  Patient will be independent with initial HEP.  Goal status: met   LONG TERM GOALS: Target date: 05/13/22  1.  Patient will report 75% improvement in L shoulder pain to improve QOL.  Goal status: progressing  2.   Patient to improve L shoulder AROM to [facility] without pain provocation to allow for increased ease of ADLs.  Baseline: flexion 110, abd 54, ER 47 all with pain Goal status: progressing  3.  Patient will demonstrate improved  functional UE strength as demonstrated by >= 4/5 in L shoulder. Goal status: partly met  4.  Patient will report 66 on FOTO (patient outcome measure)  to demonstrate improved functional ability.  Goal status: progressing   PLAN: PT FREQUENCY: 2x/week  PT DURATION: 8 weeks  PLANNED INTERVENTIONS: Therapeutic exercises, Therapeutic activity, Neuromuscular re-education, Balance training, Gait training, Patient/Family education, Joint mobilization, Electrical stimulation, Cryotherapy, Moist heat, Ionotophoresis 16m/ml Dexamethasone, and Manual therapy  PLAN FOR NEXT SESSION: Review HEP, L shoulder/scapular strengthening and AAROM  MAndris Baumann DPT [place] PT 05/12/2022, 1:56 PM

## 2022-05-13 ENCOUNTER — Encounter: Payer: Medicare Other | Admitting: Physical Therapy

## 2022-06-02 ENCOUNTER — Ambulatory Visit: Payer: Medicare Other | Attending: Emergency Medicine | Admitting: Physical Therapy

## 2022-06-02 ENCOUNTER — Encounter: Payer: Self-pay | Admitting: Physical Therapy

## 2022-06-02 DIAGNOSIS — M25512 Pain in left shoulder: Secondary | ICD-10-CM | POA: Insufficient documentation

## 2022-06-02 DIAGNOSIS — M6281 Muscle weakness (generalized): Secondary | ICD-10-CM | POA: Diagnosis present

## 2022-06-02 DIAGNOSIS — M25612 Stiffness of left shoulder, not elsewhere classified: Secondary | ICD-10-CM | POA: Insufficient documentation

## 2022-06-02 NOTE — Therapy (Signed)
OUTPATIENT PHYSICAL THERAPY SHOULDER TREATMENT   Patient Name: Alexandra Bean MRN: [id number] DOB:Sep 09, 1940, 81 y.o., female Today's Date: 06/02/2022   PT End of Session - 06/02/22 1301     Visit Number 13    Date for PT Re-Evaluation 06/23/22    PT Start Time 1301    PT Stop Time 2951    PT Time Calculation (min) 44 min    Activity Tolerance Patient tolerated treatment well    Behavior During Therapy [facility] for tasks assessed/performed              Past Medical History:  Diagnosis Date   Arthritis    Diabetes mellitus without complication ([place])    Hyperlipidemia    Hypertension    Murmur 10/13/2018   Past Surgical History:  Procedure Laterality Date   ABDOMINAL HYSTERECTOMY     pt. 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   TOTAL KNEE ARTHROPLASTY  06/20/2012   Procedure: TOTAL KNEE ARTHROPLASTY;  Surgeon: Mauri Pole, MD;  Location: [facility];  Service: Orthopedics;  Laterality: Right;   Patient Active Problem List   Diagnosis Date Noted   Nonrheumatic tricuspid valve regurgitation 10/14/2018   Controlled type 2 diabetes mellitus ([place]) 10/14/2018   Murmur 10/13/2018   Essential hypertension 10/11/2018   Nonrheumatic mitral valve regurgitation 10/11/2018   Hyperlipidemia 10/11/2018   Chest pain 10/11/2018   Hyponatremia 06/22/2012   Expected blood loss anemia 06/21/2012   Overweight (BMI 25.0-29.9) 06/21/2012   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    PCP: Eldridge Abrahams  REFERRING PROVIDER: Madalyn Rob  REFERRING DIAG: O84.166A, W19.Merril Abbe  THERAPY DIAG:  Acute pain of left shoulder  Muscle weakness (generalized)  Rationale for Evaluation and Treatment Rehabilitation  ONSET DATE: 01/01/22  SUBJECTIVE:                                                                                                                                                                                      SUBJECTIVE STATEMENT: Fine  PERTINENT HISTORY: Hysterectomy  TKA  06/20/2012 DM  PAIN:  Are you having pain? 0/10 in L shoulder  PRECAUTIONS: Fall  WEIGHT BEARING RESTRICTIONS No  FALLS:  Has patient fallen in last 6 months? Yes. Number of falls 1  LIVING ENVIRONMENT: Lives with: lives with their family Lives in: House/apartment Stairs: Yes: Internal: 16 steps; does not go upstairs Has following equipment at home: Single point cane but does not use   OCCUPATION: Stays at home, retired  PLOF: Independent with household mobility without device  PATIENT GOALS to not have pain  OBJECTIVE:   DIAGNOSTIC FINDINGS:  EXAM: LEFT SHOULDER - 2+  VIEW   COMPARISON:  Left shoulder radiographs 01/01/2022 (earlier same day).   FINDINGS: Interval reduction of the prior anterior shoulder dislocation. The humeral head is now normally located with respect to the glenoid fossa. No definite acute fracture is seen. Mild inferior acromioclavicular joint space narrowing is again seen. There are degenerative endplate osteophytes of the thoracic spine.   IMPRESSION: Interval reduction of left shoulder.      SENSATION: WFL  POSTURE: Forward head, rounded shoulders  UPPER EXTREMITY ROM: seated ROM, R WFL  Active ROM Right eval Left eval Left 04/08/22 L 04/28/22   Pain with all  Left 05/12/22  Shoulder flexion  110 w/pain 140 w/  pain 155 160  Shoulder extension       Shoulder abduction  54 w/pain 95 w/ pain 119 115  Shoulder adduction       Shoulder internal rotation  WFL but pain  WFL   Shoulder external rotation  47 w/pain 75 72 76  Elbow flexion       Elbow extension       Wrist flexion       Wrist extension       Wrist ulnar deviation       Wrist radial deviation       Wrist pronation       Wrist supination       (Blank rows = not tested)  UPPER EXTREMITY MMT:  MMT Right eval Left eval Left  04/28/22 Left 05/12/22  Shoulder flexion 4 2- w/pain 4- 4-  Shoulder extension      Shoulder abduction 4 2- w/pain 4- 4-  Shoulder adduction       Shoulder internal rotation _0 Shoulder external rotation 4 3 4+ 4  Middle trapezius      Lower trapezius      Elbow flexion 4 4    Elbow extension 4 4    Wrist flexion      Wrist extension      Wrist ulnar deviation      Wrist radial deviation      Wrist pronation      Wrist supination      Grip strength (lbs)      (Blank rows = not tested)  SHOULDER SPECIAL TESTS:  Impingement tests: Painful arc test: positive   Rotator cuff assessment: Drop arm test: positive  and Empty can test: positive    JOINT MOBILITY TESTING:  Increased muscle guarding with PROM   PALPATION:  Atrophy of L scapular muscles, not TTP   TODAY'S TREATMENT:  06/02/22 UBE L2 x 3 min each Standing shoulder Flex 2lb 2x10 Standing shoulder abduction 1lb 2x10  Horizontal abd redTB 2x10  Rows green 2x10 Shoulder ext greenTB 2x10  OHP 2lb WaTE 2x10  Triceps Ext 10lb 2x10 Biceps Curls 3lb 2x10 Shoulder ER/IR redTB 2x10    05/12/22 Recheck ROM  Recheck MMT UBE L3 x68mns  OHP red ball 2x10  Horizontal abd redTB 2x10  Yellow 3 way shoulder 2x5  ER redTB 2x10  Lateral raise 1# 2x10 Shoulder flexion 2# WaTe 2x10 Shoulder ext greenTB 2x10  Scaption raises 1# x10, x5 due to fatigue  05/05/22 UBE L2 x658ms  Scaption raises 1# 2x10 Shoulder flex 1# 2x10 Lateral raises 1# 2x10 Rows and lats 15# 2x10 Shoulder ext greenTB 2x10 Shoulder ER/IR redTB 2x10   04/28/22 UBE L1.7 x3 min each  LUE fex, CW, and CCW 1lb again wall with pillow case  Rows 10lb Lats  15lb 2x10 Shoulder Ext red 2x10  Shoulder flex 2lb 2x10  Shoulder abs AROM 2x10   04/22/22 UBE L1.5 x3 each  Rows 10lb Lats 15lb 2x10 Shoulder Flex 2lb 2x10 Shoulder Abd 2x10 Shoulder ER red 2x10  LUE PROM all directions   PATIENT EDUCATION: Education details: POC Person educated: Patient Education method: Customer service manager Education comprehension: verbalized understanding and returned demonstration   HOME EXERCISE  PROGRAM: [id number]  ASSESSMENT:  CLINICAL IMPRESSION: We continued working L shoulder strengthening with low weights as tolerated. Good effort given throughout session, fatigues with 1# abduction raises. Tactile cue required for form with ER/IR. Slight limitation noted with LUE doing OHP      OBJECTIVE IMPAIRMENTS Abnormal gait, decreased balance, difficulty walking, decreased ROM, decreased strength, decreased safety awareness, impaired UE functional use, and pain.   ACTIVITY LIMITATIONS carrying, lifting, reach over head, and locomotion level  PARTICIPATION LIMITATIONS: cleaning, laundry, and yard work  PERSONAL FACTORS Age and 1-2 comorbidities: arthritis, DM, HTN  are also affecting patient's functional outcome.   REHAB POTENTIAL: Good  CLINICAL DECISION MAKING: Stable/uncomplicated  EVALUATION COMPLEXITY: Low   GOALS: Goals reviewed with patient? Yes  SHORT TERM GOALS: Target date: 03/03/22  Patient will be independent with initial HEP.  Goal status: met   LONG TERM GOALS: Target date: 05/13/22  1.  Patient will report 75% improvement in L shoulder pain to improve QOL.  Goal status: progressing  2.   Patient to improve L shoulder AROM to [facility] without pain provocation to allow for increased ease of ADLs.  Baseline: flexion 110, abd 54, ER 47 all with pain Goal status: progressing  3.  Patient will demonstrate improved functional UE strength as demonstrated by >= 4/5 in L shoulder. Goal status: partly met  4.  Patient will report 49 on FOTO (patient outcome measure)  to demonstrate improved functional ability.  Goal status: progressing   PLAN: PT FREQUENCY: 2x/week  PT DURATION: 8 weeks  PLANNED INTERVENTIONS: Therapeutic exercises, Therapeutic activity, Neuromuscular re-education, Balance training, Gait training, Patient/Family education, Joint mobilization, Electrical stimulation, Cryotherapy, Moist heat, Ionotophoresis 101m/ml Dexamethasone, and Manual  therapy  PLAN FOR NEXT SESSION: Review HEP, L shoulder/scapular strengthening and [place] DPT RScot Jun PTA 06/02/2022, 1:02 PM

## 2022-06-09 ENCOUNTER — Ambulatory Visit: Payer: Medicare Other | Admitting: Physical Therapy

## 2022-06-09 ENCOUNTER — Encounter: Payer: Self-pay | Admitting: Physical Therapy

## 2022-06-09 DIAGNOSIS — M25512 Pain in left shoulder: Secondary | ICD-10-CM | POA: Diagnosis not present

## 2022-06-09 DIAGNOSIS — M25612 Stiffness of left shoulder, not elsewhere classified: Secondary | ICD-10-CM

## 2022-06-09 DIAGNOSIS — M6281 Muscle weakness (generalized): Secondary | ICD-10-CM

## 2022-06-09 NOTE — Therapy (Signed)
OUTPATIENT PHYSICAL THERAPY SHOULDER TREATMENT   Patient Name: Alexandra Bean MRN: [id number] DOB:10-13-1940, 81 y.o., female Today's Date: 06/09/2022   PT End of Session - 06/09/22 1301     Visit Number 14    Date for PT Re-Evaluation 06/23/22    PT Start Time 1300    PT Stop Time 1345    PT Time Calculation (min) 45 min    Activity Tolerance Patient tolerated treatment well    Behavior During Therapy [facility] for tasks assessed/performed              Past Medical History:  Diagnosis Date   Arthritis    Diabetes mellitus without complication ([facility])    Hyperlipidemia    Hypertension    Murmur 10/13/2018   Past Surgical History:  Procedure Laterality Date   ABDOMINAL HYSTERECTOMY     pt. 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   TOTAL KNEE ARTHROPLASTY  06/20/2012   Procedure: TOTAL KNEE ARTHROPLASTY;  Surgeon: Shelda Pal, MD;  Location: [facility];  Service: Orthopedics;  Laterality: Right;   Patient Active Problem List   Diagnosis Date Noted   Nonrheumatic tricuspid valve regurgitation 10/14/2018   Controlled type 2 diabetes mellitus ([facility]) 10/14/2018   Murmur 10/13/2018   Essential hypertension 10/11/2018   Nonrheumatic mitral valve regurgitation 10/11/2018   Hyperlipidemia 10/11/2018   Chest pain 10/11/2018   Hyponatremia 06/22/2012   Expected blood loss anemia 06/21/2012   Overweight (BMI 25.0-29.9) 06/21/2012   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    PCP: Zoe Lan  REFERRING PROVIDER: Marianna Fuss  REFERRING DIAG: Z41.444G, W19.XXXA  THERAPY DIAG:  Acute pain of left shoulder  Muscle weakness (generalized)  Stiffness of left shoulder, not elsewhere classified  Rationale for Evaluation and Treatment Rehabilitation  ONSET DATE: 01/01/22  SUBJECTIVE:                                                                                                                                                                                      SUBJECTIVE  STATEMENT: "Good"  PERTINENT HISTORY: Hysterectomy  TKA 06/20/2012 DM  PAIN:  Are you having pain? 0/10 in L shoulder  PRECAUTIONS: Fall  WEIGHT BEARING RESTRICTIONS No  FALLS:  Has patient fallen in last 6 months? Yes. Number of falls 1  LIVING ENVIRONMENT: Lives with: lives with their family Lives in: House/apartment Stairs: Yes: Internal: 16 steps; does not go upstairs Has following equipment at home: Single point cane but does not use   OCCUPATION: Stays at home, retired  PLOF: Independent with household mobility without device  PATIENT GOALS to not have pain  OBJECTIVE:  DIAGNOSTIC FINDINGS:  EXAM: LEFT SHOULDER - 2+ VIEW   COMPARISON:  Left shoulder radiographs 01/01/2022 (earlier same day).   FINDINGS: Interval reduction of the prior anterior shoulder dislocation. The humeral head is now normally located with respect to the glenoid fossa. No definite acute fracture is seen. Mild inferior acromioclavicular joint space narrowing is again seen. There are degenerative endplate osteophytes of the thoracic spine.   IMPRESSION: Interval reduction of left shoulder.      SENSATION: WFL  POSTURE: Forward head, rounded shoulders  UPPER EXTREMITY ROM: seated ROM, R WFL  Active ROM Right eval Left eval Left 04/08/22 L 04/28/22   Pain with all  Left 05/12/22 LUE  06/09/22  Shoulder flexion  110 w/pain 140 w/  pain 155 160 165  Shoulder extension        Shoulder abduction  54 w/pain 95 w/ pain 119 115 135  Shoulder adduction        Shoulder internal rotation  WFL but pain  WFL    Shoulder external rotation  47 w/pain 75 72 76 86  Elbow flexion        Elbow extension        Wrist flexion        Wrist extension        Wrist ulnar deviation        Wrist radial deviation        Wrist pronation        Wrist supination        (Blank rows = not tested)  UPPER EXTREMITY MMT:  MMT Right eval Left eval Left  04/28/22 Left 05/12/22 Left 06/09/22  Shoulder  flexion 4 2- w/pain 4- 4- 4  Shoulder extension       Shoulder abduction 4 2- w/pain 4- 4- 4  Shoulder adduction       Shoulder internal rotation $RemoveBeforeDEI'4 3 4 4   '[id number]$ Shoulder external rotation 4 3 4+ 4   Middle trapezius       Lower trapezius       Elbow flexion 4 4     Elbow extension 4 4     Wrist flexion       Wrist extension       Wrist ulnar deviation       Wrist radial deviation       Wrist pronation       Wrist supination       Grip strength (lbs)       (Blank rows = not tested)  SHOULDER SPECIAL TESTS:  Impingement tests: Painful arc test: positive   Rotator cuff assessment: Drop arm test: positive  and Empty can test: positive    JOINT MOBILITY TESTING:  Increased muscle guarding with PROM   PALPATION:  Atrophy of L scapular muscles, not TTP   TODAY'S TREATMENT:  06/09/22 UBE L3 x 6 min Standing shoulder Flex 2lb 2x12 Standing shoulder Abd 2lb 2x12 Seated rows Green 2x15 Seated OGP 2lb WaTE 2x10 Shoulder Ext red 2x10  Shoulder ER/IR redTB 2x10  Biceps Curls 3lb 2x15   06/02/22 UBE L2 x 3 min each Standing shoulder Flex 2lb 2x10 Standing shoulder abduction 1lb 2x10  Horizontal abd redTB 2x10  Rows green 2x10 Shoulder ext greenTB 2x10  OHP 2lb WaTE 2x10  Triceps Ext 10lb 2x10 Biceps Curls 3lb 2x10 Shoulder ER/IR redTB 2x10     PATIENT EDUCATION: Education details: POC Person educated: Patient Education method: Customer service manager Education comprehension: verbalized understanding and returned demonstration  HOME EXERCISE PROGRAM: [id number]  ASSESSMENT:  CLINICAL IMPRESSION: Pt has progressed increasing L shoulder ROM and strength. We continued working L shoulder strengthening with low weights as tolerated. Good effort given throughout session. Some L shoulder pain reported with shoulder abduction. Tactile cue required for arm positioning with ER. Slight limitation noted with LUE doing OHP     OBJECTIVE IMPAIRMENTS Abnormal gait, decreased  balance, difficulty walking, decreased ROM, decreased strength, decreased safety awareness, impaired UE functional use, and pain.   ACTIVITY LIMITATIONS carrying, lifting, reach over head, and locomotion level  PARTICIPATION LIMITATIONS: cleaning, laundry, and yard work  PERSONAL FACTORS Age and 1-2 comorbidities: arthritis, DM, HTN  are also affecting patient's functional outcome.   REHAB POTENTIAL: Good  CLINICAL DECISION MAKING: Stable/uncomplicated  EVALUATION COMPLEXITY: Low   GOALS: Goals reviewed with patient? Yes  SHORT TERM GOALS: Target date: 03/03/22  Patient will be independent with initial HEP.  Goal status: met   LONG TERM GOALS: Target date: 05/13/22  1.  Patient will report 75% improvement in L shoulder pain to improve QOL.  Goal status: progressing  2.   Patient to improve L shoulder AROM to [facility] without pain provocation to allow for increased ease of ADLs.  Baseline: flexion 110, abd 54, ER 47 all with pain Goal status: Partly Met 06/09/22  3.  Patient will demonstrate improved functional UE strength as demonstrated by >= 4/5 in L shoulder. Goal status:  Met 06/09/22    4.  Patient will report 72 on FOTO (patient outcome measure)  to demonstrate improved functional ability.  Goal status: progressing   PLAN: PT FREQUENCY: 2x/week  PT DURATION: 8 weeks  PLANNED INTERVENTIONS: Therapeutic exercises, Therapeutic activity, Neuromuscular re-education, Balance training, Gait training, Patient/Family education, Joint mobilization, Electrical stimulation, Cryotherapy, Moist heat, Ionotophoresis 4mg /ml Dexamethasone, and Manual therapy  PLAN FOR NEXT SESSION: Review HEP, L shoulder/scapular strengthening and [place], DPT Scot Jun, PTA 06/09/2022, 1:02 PM

## 2022-06-15 NOTE — Therapy (Signed)
OUTPATIENT PHYSICAL THERAPY SHOULDER TREATMENT   Patient Name: Alexandra Bean MRN: [id number] DOB:12/27/1940, 81 y.o., female Today's Date: 06/16/2022   PT End of Session - 06/16/22 1300     Visit Number 15    Date for PT Re-Evaluation 06/23/22    PT Start Time 1300    PT Stop Time 5170    PT Time Calculation (min) 45 min    Activity Tolerance Patient tolerated treatment well    Behavior During Therapy [facility] for tasks assessed/performed               Past Medical History:  Diagnosis Date   Arthritis    Diabetes mellitus without complication ([place])    Hyperlipidemia    Hypertension    Murmur 10/13/2018   Past Surgical History:  Procedure Laterality Date   ABDOMINAL HYSTERECTOMY     pt. 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   TOTAL KNEE ARTHROPLASTY  06/20/2012   Procedure: TOTAL KNEE ARTHROPLASTY;  Surgeon: Mauri Pole, MD;  Location: [facility];  Service: Orthopedics;  Laterality: Right;   Patient Active Problem List   Diagnosis Date Noted   Nonrheumatic tricuspid valve regurgitation 10/14/2018   Controlled type 2 diabetes mellitus ([place]) 10/14/2018   Murmur 10/13/2018   Essential hypertension 10/11/2018   Nonrheumatic mitral valve regurgitation 10/11/2018   Hyperlipidemia 10/11/2018   Chest pain 10/11/2018   Hyponatremia 06/22/2012   Expected blood loss anemia 06/21/2012   Overweight (BMI 25.0-29.9) 06/21/2012   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    PCP: Eldridge Abrahams  REFERRING PROVIDER: Madalyn Rob  REFERRING DIAG: Y17.494W, W19.XXXA  THERAPY DIAG:  Acute pain of left shoulder  Muscle weakness (generalized)  Stiffness of left shoulder, not elsewhere classified  Rationale for Evaluation and Treatment Rehabilitation  ONSET DATE: 01/01/22  SUBJECTIVE:                                                                                                                                                                                      SUBJECTIVE  STATEMENT: I am fine, I am 90% better.   PERTINENT HISTORY: Hysterectomy  TKA 06/20/2012 DM  PAIN:  Are you having pain? 0/10 in L shoulder  PRECAUTIONS: Fall  WEIGHT BEARING RESTRICTIONS No  FALLS:  Has patient fallen in last 6 months? Yes. Number of falls 1  LIVING ENVIRONMENT: Lives with: lives with their family Lives in: House/apartment Stairs: Yes: Internal: 16 steps; does not go upstairs Has following equipment at home: Single point cane but does not use   OCCUPATION: Stays at home, retired  PLOF: Independent with household mobility without device  PATIENT GOALS  to not have pain  OBJECTIVE:   DIAGNOSTIC FINDINGS:  EXAM: LEFT SHOULDER - 2+ VIEW   COMPARISON:  Left shoulder radiographs 01/01/2022 (earlier same day).   FINDINGS: Interval reduction of the prior anterior shoulder dislocation. The humeral head is now normally located with respect to the glenoid fossa. No definite acute fracture is seen. Mild inferior acromioclavicular joint space narrowing is again seen. There are degenerative endplate osteophytes of the thoracic spine.   IMPRESSION: Interval reduction of left shoulder.      SENSATION: WFL  POSTURE: Forward head, rounded shoulders  UPPER EXTREMITY ROM: seated ROM, R WFL  Active ROM Right eval Left eval Left 04/08/22 L 04/28/22   Pain with all  Left 05/12/22 LUE  06/09/22  Shoulder flexion  110 w/pain 140 w/  pain 155 160 165  Shoulder extension        Shoulder abduction  54 w/pain 95 w/ pain 119 115 135  Shoulder adduction        Shoulder internal rotation  WFL but pain  WFL    Shoulder external rotation  47 w/pain 75 72 76 86  Elbow flexion        Elbow extension        Wrist flexion        Wrist extension        Wrist ulnar deviation        Wrist radial deviation        Wrist pronation        Wrist supination        (Blank rows = not tested)  UPPER EXTREMITY MMT:  MMT Right eval Left eval Left  04/28/22 Left 05/12/22 Left  06/09/22  Shoulder flexion 4 2- w/pain 4- 4- 4  Shoulder extension       Shoulder abduction 4 2- w/pain 4- 4- 4  Shoulder adduction       Shoulder internal rotation $RemoveBeforeDEI'4 3 4 4   'jJaMEdSpEmMeGAMQ$ Shoulder external rotation 4 3 4+ 4   Middle trapezius       Lower trapezius       Elbow flexion 4 4     Elbow extension 4 4     Wrist flexion       Wrist extension       Wrist ulnar deviation       Wrist radial deviation       Wrist pronation       Wrist supination       Grip strength (lbs)       (Blank rows = not tested)  SHOULDER SPECIAL TESTS:  Impingement tests: Painful arc test: positive   Rotator cuff assessment: Drop arm test: positive  and Empty can test: positive    JOINT MOBILITY TESTING:  Increased muscle guarding with PROM   PALPATION:  Atrophy of L scapular muscles, not TTP   TODAY'S TREATMENT:  06/16/22 NuStep L5 x19mins Rows 15# 2x10 Lats 20# 2x10  Rows and ext greenTB 2x10 ER redTB 2x10  RedTB horizontal abd 2x10 Shoulder flexion 2# 2x10 Shoulder abd 2# 2x10 Yellow band resistance at wrist and shoulder flexion 2x10 OHP with yellow ball 2x10 Ext with 2# 2x10   06/09/22 UBE L3 x 6 min Standing shoulder Flex 2lb 2x12 Standing shoulder Abd 2lb 2x12 Seated rows Green 2x15 Seated OGP 2lb WaTE 2x10 Shoulder Ext red 2x10  Shoulder ER/IR redTB 2x10  Biceps Curls 3lb 2x15   06/02/22 UBE L2 x 3 min each Standing shoulder Flex 2lb 2x10 Standing  shoulder abduction 1lb 2x10  Horizontal abd redTB 2x10  Rows green 2x10 Shoulder ext greenTB 2x10  OHP 2lb WaTE 2x10  Triceps Ext 10lb 2x10 Biceps Curls 3lb 2x10 Shoulder ER/IR redTB 2x10     PATIENT EDUCATION: Education details: POC Person educated: Patient Education method: Customer service manager Education comprehension: verbalized understanding and returned demonstration   HOME EXERCISE PROGRAM: [id number]  ASSESSMENT:  CLINICAL IMPRESSION: Patient has made good progress with L shoulder range and strength. She  states she is almost back to 100%. She has one more appt scheduled and thinks she will be good after that to continue doing her exercises at home. Tolerates session well without pain.     OBJECTIVE IMPAIRMENTS Abnormal gait, decreased balance, difficulty walking, decreased ROM, decreased strength, decreased safety awareness, impaired UE functional use, and pain.   ACTIVITY LIMITATIONS carrying, lifting, reach over head, and locomotion level  PARTICIPATION LIMITATIONS: cleaning, laundry, and yard work  PERSONAL FACTORS Age and 1-2 comorbidities: arthritis, DM, HTN  are also affecting patient's functional outcome.   REHAB POTENTIAL: Good  CLINICAL DECISION MAKING: Stable/uncomplicated  EVALUATION COMPLEXITY: Low   GOALS: Goals reviewed with patient? Yes  SHORT TERM GOALS: Target date: 03/03/22  Patient will be independent with initial HEP.  Goal status: met   LONG TERM GOALS: Target date: 05/13/22  1.  Patient will report 75% improvement in L shoulder pain to improve QOL.  Goal status: progressing  2.   Patient to improve L shoulder AROM to [facility] without pain provocation to allow for increased ease of ADLs.  Baseline: flexion 110, abd 54, ER 47 all with pain Goal status: Partly Met 06/09/22  3.  Patient will demonstrate improved functional UE strength as demonstrated by >= 4/5 in L shoulder. Goal status:  Met 06/09/22    4.  Patient will report 27 on FOTO (patient outcome measure)  to demonstrate improved functional ability.  Goal status: progressing   PLAN: PT FREQUENCY: 2x/week  PT DURATION: 8 weeks  PLANNED INTERVENTIONS: Therapeutic exercises, Therapeutic activity, Neuromuscular re-education, Balance training, Gait training, Patient/Family education, Joint mobilization, Electrical stimulation, Cryotherapy, Moist heat, Ionotophoresis 4mg /ml Dexamethasone, and Manual therapy  PLAN FOR NEXT SESSION: review goals, plan on d/c- update HEP with appropriate exercises     Andris Baumann, PT 06/16/2022, 1:44 PM

## 2022-06-16 ENCOUNTER — Ambulatory Visit: Payer: Medicare Other

## 2022-06-16 DIAGNOSIS — M25512 Pain in left shoulder: Secondary | ICD-10-CM | POA: Diagnosis not present

## 2022-06-16 DIAGNOSIS — M6281 Muscle weakness (generalized): Secondary | ICD-10-CM

## 2022-06-16 DIAGNOSIS — M25612 Stiffness of left shoulder, not elsewhere classified: Secondary | ICD-10-CM

## 2022-06-23 ENCOUNTER — Encounter: Payer: Self-pay | Admitting: Physical Therapy

## 2022-06-23 ENCOUNTER — Ambulatory Visit: Payer: Medicare Other | Admitting: Physical Therapy

## 2022-06-23 DIAGNOSIS — M6281 Muscle weakness (generalized): Secondary | ICD-10-CM

## 2022-06-23 DIAGNOSIS — M25512 Pain in left shoulder: Secondary | ICD-10-CM

## 2022-06-23 DIAGNOSIS — M25612 Stiffness of left shoulder, not elsewhere classified: Secondary | ICD-10-CM

## 2022-06-23 NOTE — Therapy (Signed)
OUTPATIENT PHYSICAL THERAPY SHOULDER TREATMENT   Patient Name: Alexandra Bean MRN: [id number] DOB:1941/03/12, 81 y.o., female Today's Date: 06/23/2022   PT End of Session - 06/23/22 1304     Visit Number 16    Date for PT Re-Evaluation 06/23/22    PT Start Time 1300    PT Stop Time 7628    PT Time Calculation (min) 45 min    Activity Tolerance Patient tolerated treatment well    Behavior During Therapy [facility] for tasks assessed/performed               Past Medical History:  Diagnosis Date   Arthritis    Diabetes mellitus without complication ([place])    Hyperlipidemia    Hypertension    Murmur 10/13/2018   Past Surgical History:  Procedure Laterality Date   ABDOMINAL HYSTERECTOMY     pt. 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   TOTAL KNEE ARTHROPLASTY  06/20/2012   Procedure: TOTAL KNEE ARTHROPLASTY;  Surgeon: Mauri Pole, MD;  Location: [facility];  Service: Orthopedics;  Laterality: Right;   Patient Active Problem List   Diagnosis Date Noted   Nonrheumatic tricuspid valve regurgitation 10/14/2018   Controlled type 2 diabetes mellitus ([place]) 10/14/2018   Murmur 10/13/2018   Essential hypertension 10/11/2018   Nonrheumatic mitral valve regurgitation 10/11/2018   Hyperlipidemia 10/11/2018   Chest pain 10/11/2018   Hyponatremia 06/22/2012   Expected blood loss anemia 06/21/2012   Overweight (BMI 25.0-29.9) 06/21/2012   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    PCP: Eldridge Abrahams  REFERRING PROVIDER: Madalyn Rob  REFERRING DIAG: B15.176H, W19.XXXA  THERAPY DIAG:  Acute pain of left shoulder  Muscle weakness (generalized)  Stiffness of left shoulder, not elsewhere classified  Rationale for Evaluation and Treatment Rehabilitation  ONSET DATE: 01/01/22  SUBJECTIVE:                                                                                                                                                                                      SUBJECTIVE  STATEMENT: I am fine.   PERTINENT HISTORY: Hysterectomy  TKA 06/20/2012 DM  PAIN:  Are you having pain? 0/10 in L shoulder  PRECAUTIONS: Fall  WEIGHT BEARING RESTRICTIONS No  FALLS:  Has patient fallen in last 6 months? Yes. Number of falls 1  LIVING ENVIRONMENT: Lives with: lives with their family Lives in: House/apartment Stairs: Yes: Internal: 16 steps; does not go upstairs Has following equipment at home: Single point cane but does not use   OCCUPATION: Stays at home, retired  PLOF: Independent with household mobility without device  PATIENT GOALS to not have pain  OBJECTIVE:   DIAGNOSTIC FINDINGS:  EXAM: LEFT SHOULDER - 2+ VIEW   COMPARISON:  Left shoulder radiographs 01/01/2022 (earlier same day).   FINDINGS: Interval reduction of the prior anterior shoulder dislocation. The humeral head is now normally located with respect to the glenoid fossa. No definite acute fracture is seen. Mild inferior acromioclavicular joint space narrowing is again seen. There are degenerative endplate osteophytes of the thoracic spine.   IMPRESSION: Interval reduction of left shoulder.      SENSATION: WFL  POSTURE: Forward head, rounded shoulders  UPPER EXTREMITY ROM: seated ROM, R WFL  Active ROM Right eval Left eval Left 04/08/22 L 04/28/22   Pain with all  Left 05/12/22 LUE  06/09/22  Shoulder flexion  110 w/pain 140 w/  pain 155 160 165  Shoulder extension        Shoulder abduction  54 w/pain 95 w/ pain 119 115 135  Shoulder adduction        Shoulder internal rotation  WFL but pain  WFL    Shoulder external rotation  47 w/pain 75 72 76 86  Elbow flexion        Elbow extension        Wrist flexion        Wrist extension        Wrist ulnar deviation        Wrist radial deviation        Wrist pronation        Wrist supination        (Blank rows = not tested)  UPPER EXTREMITY MMT:  MMT Right eval Left eval Left  04/28/22 Left 05/12/22 Left 06/09/22   Shoulder flexion 4 2- w/pain 4- 4- 4  Shoulder extension       Shoulder abduction 4 2- w/pain 4- 4- 4  Shoulder adduction       Shoulder internal rotation _0 Shoulder external rotation 4 3 4+ 4   Middle trapezius       Lower trapezius       Elbow flexion 4 4     Elbow extension 4 4     Wrist flexion       Wrist extension       Wrist ulnar deviation       Wrist radial deviation       Wrist pronation       Wrist supination       Grip strength (lbs)       (Blank rows = not tested)  SHOULDER SPECIAL TESTS:  Impingement tests: Painful arc test: positive   Rotator cuff assessment: Drop arm test: positive  and Empty can test: positive    JOINT MOBILITY TESTING:  Increased muscle guarding with PROM   PALPATION:  Atrophy of L scapular muscles, not TTP   TODAY'S TREATMENT:  06/23/22 UBE L3 x 6 min Standing shoulder Flex 2lb 2x12 Standing shoulder Abd 2lb 2x12 ER redTB 2x10  Seated rows blue 2x10 Shoulder Ext green 2x10 OHP yellow ball 2x10 RedTB horizontal abd 2x10  06/16/22 NuStep L5 x14mns Rows 15# 2x10 Lats 20# 2x10  Rows and ext greenTB 2x10 ER redTB 2x10  RedTB horizontal abd 2x10 Shoulder flexion 2# 2x10 Shoulder abd 2# 2x10 Yellow band resistance at wrist and shoulder flexion 2x10 OHP with yellow ball 2x10 Ext with 2# 2x10   06/09/22 UBE L3 x 6 min Standing shoulder Flex 2lb 2x12 Standing shoulder Abd 2lb 2x12 Seated rows Green 2x15 Seated OGP  2lb WaTE 2x10 Shoulder Ext red 2x10  Shoulder ER/IR redTB 2x10  Biceps Curls 3lb 2x15   06/02/22 UBE L2 x 3 min each Standing shoulder Flex 2lb 2x10 Standing shoulder abduction 1lb 2x10  Horizontal abd redTB 2x10  Rows green 2x10 Shoulder ext greenTB 2x10  OHP 2lb WaTE 2x10  Triceps Ext 10lb 2x10 Biceps Curls 3lb 2x10 Shoulder ER/IR redTB 2x10     PATIENT EDUCATION: Education details: POC Person educated: Patient Education method: Customer service manager Education comprehension:  verbalized understanding and returned demonstration   HOME EXERCISE PROGRAM: [id number]  ASSESSMENT:  CLINICAL IMPRESSION: Patient has made good progress with L shoulder range and strength. She is pleased with her current functional status and reports no functional limitations.    OBJECTIVE IMPAIRMENTS Abnormal gait, decreased balance, difficulty walking, decreased ROM, decreased strength, decreased safety awareness, impaired UE functional use, and pain.   ACTIVITY LIMITATIONS carrying, lifting, reach over head, and locomotion level  PARTICIPATION LIMITATIONS: cleaning, laundry, and yard work  PERSONAL FACTORS Age and 1-2 comorbidities: arthritis, DM, HTN  are also affecting patient's functional outcome.   REHAB POTENTIAL: Good  CLINICAL DECISION MAKING: Stable/uncomplicated  EVALUATION COMPLEXITY: Low   GOALS: Goals reviewed with patient? Yes  SHORT TERM GOALS: Target date: 03/03/22  Patient will be independent with initial HEP.  Goal status: met   LONG TERM GOALS: Target date: 05/13/22  1.  Patient will report 75% improvement in L shoulder pain to improve QOL.  Goal status: Met  2.   Patient to improve L shoulder AROM to [facility] without pain provocation to allow for increased ease of ADLs.  Baseline: flexion 110, abd 54, ER 47 all with pain Goal status: Partly Met 06/09/22  3.  Patient will demonstrate improved functional UE strength as demonstrated by >= 4/5 in L shoulder. Goal status:  Met 06/09/22    4.  Patient will report 31 on FOTO (patient outcome measure)  to demonstrate improved functional ability.  Goal status: progressing   PLAN: PT FREQUENCY: 2x/week  PT DURATION: 8 weeks  PLANNED INTERVENTIONS: Therapeutic exercises, Therapeutic activity, Neuromuscular re-education, Balance training, Gait training, Patient/Family education, Joint mobilization, Electrical stimulation, Cryotherapy, Moist heat, Ionotophoresis 15m/ml Dexamethasone, and Manual  therapy  PLAN FOR NEXT SESSION: review goals, plan on d/c- update HEP with appropriate exercises   PHYSICAL THERAPY DISCHARGE SUMMARY  Visits from Start of Care: 16  Patient agrees to discharge. Patient goals were partially met. Patient is being discharged due to being pleased with the current functional level.   RScot Jun PTA 06/23/2022, 1:05 PM

## 2022-06-30 ENCOUNTER — Ambulatory Visit: Payer: Medicare Other

## 2022-06-30 ENCOUNTER — Ambulatory Visit: Payer: Medicare Other | Admitting: Student

## 2022-06-30 VITALS — BP 139/83 | HR 86 | Ht 62.0 in | Wt 117.6 lb

## 2022-06-30 DIAGNOSIS — I10 Essential (primary) hypertension: Secondary | ICD-10-CM

## 2022-06-30 DIAGNOSIS — I340 Nonrheumatic mitral (valve) insufficiency: Secondary | ICD-10-CM

## 2022-06-30 DIAGNOSIS — E782 Mixed hyperlipidemia: Secondary | ICD-10-CM

## 2022-06-30 NOTE — Progress Notes (Signed)
Patient is here for follow up visit.  Subjective:   Patient ID: Alexandra Bean, female    DOB: [birth year], 81 y.o.   MRN: [id number]   Chief Complaint  Patient presents with   Follow-up    6 month   Hypertension   Hypertension Associated symptoms include neck pain (chronic). Pertinent negatives include no chest pain or palpitations.    81 y.o. [place] female with hypertension, type 2 DM, mod MR, TR.   Patient presents for 6 month follow-up of hypertension.  At last office visit blood pressure was elevated and she was advised to increase Amlodipine to 10mg  daily. However, she has only been taking Amlodipine 5mg  daily.  Patient is enrolled in remote patient monitoring.  Her average systolic blood pressure is 130. She denies chest pain, shortness of breath, palpitations, leg edema, orthopnea, PND, syncope.  Current Outpatient Medications on File Prior to Visit  Medication Sig Dispense Refill   alendronate (FOSAMAX) 70 MG tablet Take 70 mg by mouth once a week.     amLODipine (NORVASC) 5 MG tablet TAKE 1 TABLET(5 MG) BY MOUTH DAILY 90 tablet 3   atorvastatin (LIPITOR) 20 MG tablet Take 1 tablet (20 mg total) by mouth daily. 90 tablet 3   hydrOXYzine (ATARAX) 10 MG tablet Take 10 mg by mouth daily as needed for itching.     metFORMIN (GLUCOPHAGE) 500 MG tablet Take 500 mg by mouth 2 (two) times daily with a meal.     Naphazoline HCl (CLEAR EYES OP) Place 1 drop into both eyes daily as needed. For dry eyes     triamcinolone ointment (KENALOG) 0.1 % Apply 1 application topically as needed.     No current facility-administered medications on file prior to visit.    Cardiovascular studies: EKG 06/30/2022: Normal sinus rhythm at rate of 79 bpm.  Left axis deviation.  Left anterior fascicular block.  Early repolarization.  Compared to previous EKG on 01/01/2022, no significant change.  Echocardiogram 10/04/2018: Left ventricle cavity is normal in size. Moderate concentric hypertrophy of the  left ventricle. Normal global wall motion. Calculated EF 55%. Left atrial cavity is mildly dilated. Moderate (Grade II) mitral regurgitation. Moderate tricuspid regurgitation. Estimated pulmonary artery systolic pressure 28  mmHg. No significant change compared to previous study in 09/2017.  Lexiscan myoview stress test 10/04/2017:  1. Pharmacologic stress testing was performed with intravenous administration of .4 mg of Lexiscan over a 10-15 seconds infusion. Stress symptoms included dyspnea, dizziness. 2. Exercise capacity not assessed. Stress EKG is non diagnostic for ischemia as it is a pharmacologic stress.  3. The overall quality of the study is excellent. There is no evidence of abnormal lung activity. Stress and rest SPECT images demonstrate homogeneous tracer distribution throughout the myocardium. Gated SPECT imaging reveals normal myocardial thickening and wall motion. The left ventricular ejection fraction was normal (69%).   4. This is a low risk study.  Review of Systems  Cardiovascular:  Negative for chest pain, dyspnea on exertion, leg swelling, palpitations and syncope.  Musculoskeletal:  Positive for neck pain (chronic). Negative for joint pain.   Objective:    Vitals:   06/30/22 0916 06/30/22 0926  BP: (!) 151/83 139/83  Pulse: 92 86  SpO2: 96% 96%     Physical Exam Vitals and nursing note reviewed.  Constitutional:      General: She is not in acute distress. Neck:     Vascular: No JVD.  Cardiovascular:     Rate and Rhythm: Normal  rate and regular rhythm.     Heart sounds: Normal heart sounds. No murmur heard. Pulmonary:     Effort: Pulmonary effort is normal.     Breath sounds: Normal breath sounds. No wheezing or rales.  Musculoskeletal:     Right lower leg: No edema.     Left lower leg: No edema.  Neurological:     General: No focal deficit present.     Mental Status: She is oriented to person, place, and time.     Cranial Nerves: No cranial nerve  deficit.    Assessment & Recommendations:   81 y.o. [place] female with hypertension, type 2 DM, mod MR, TR.   Primary hypertension: Blood pressure was sightly elevated during initial check at today's visit however, blood pressure improved with recheck.  She has stopped taking herbal supplements. At this time will continue Amlodipine 5mg  daily and continue to monitor blood pressure through remote patient monitoring.  Advised low sodium diet less than 2000 mg daily and exercise as tolerated.  RPM Patient: October 2023 BP DATA Average Systolic BP Level 151.76 mmHg Lowest Systolic BP Level 160 mmHg Highest Systolic BP Level 737 mmHg  Mixed hyperlipidemia: She continues on atorvastatin without myalgias. Reviewed previous lipid profile, lipids are under good control.  Nonrheumatic mitral and tricuspid valve regurgitation Moderate, asymptomatic.   Follow-up in 6 months, sooner if needed.   Ernst Spell, AGNP-C 06/30/2022, 10:46 AM Office: [phone]

## 2022-09-27 IMAGING — CT CT HEAD W/O CM
3 series · 14 of 47 positions shown, 16 images · non-contrast
Comparison: None Available.

CLINICAL DATA: Fall



[Series 3: head 5.0 h30s · axial · 0.41mm/px · z∈[-274,-134]mm · 8 of 34 slices shown, 10 images]
[im 3/34  brain]
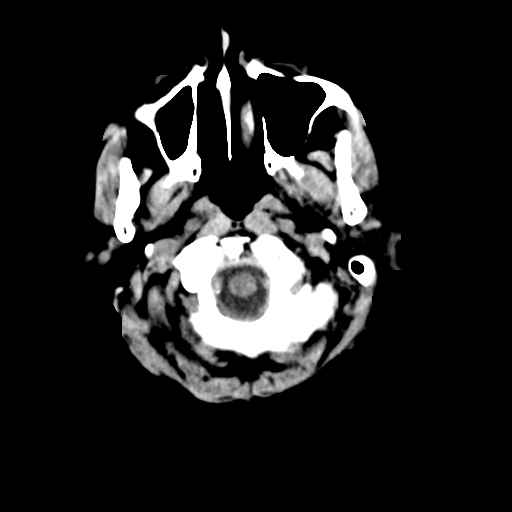
[im 3/34  bone]
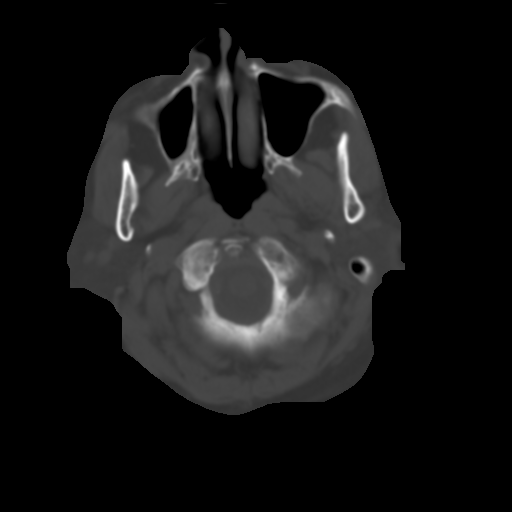
[im 7/34  brain]
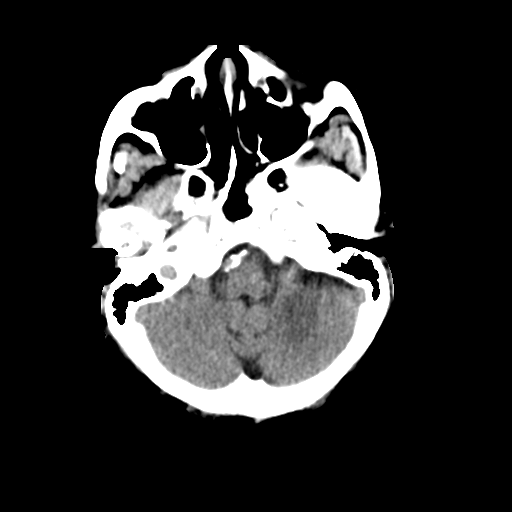
[im 11/34  brain]
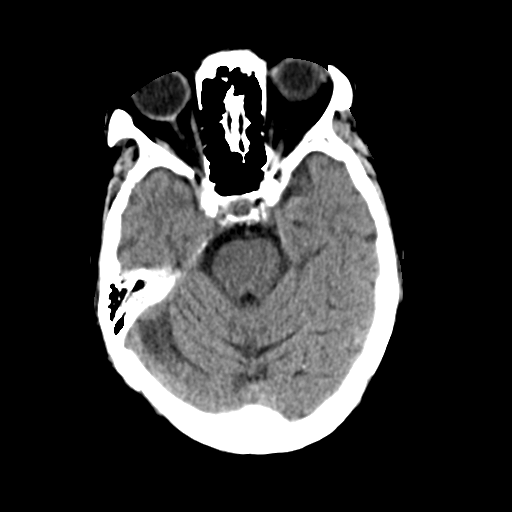
[im 15/34  brain]
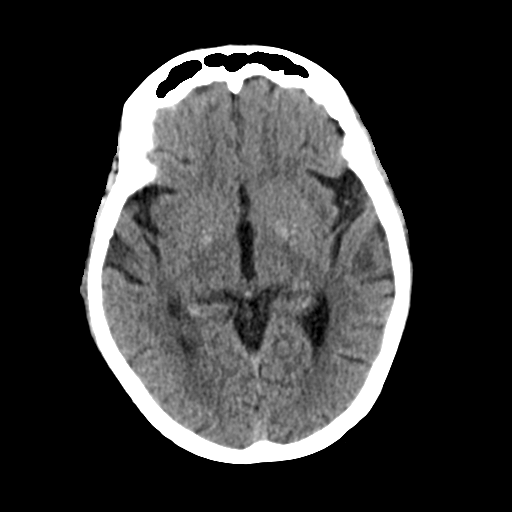
[im 19/34  brain]
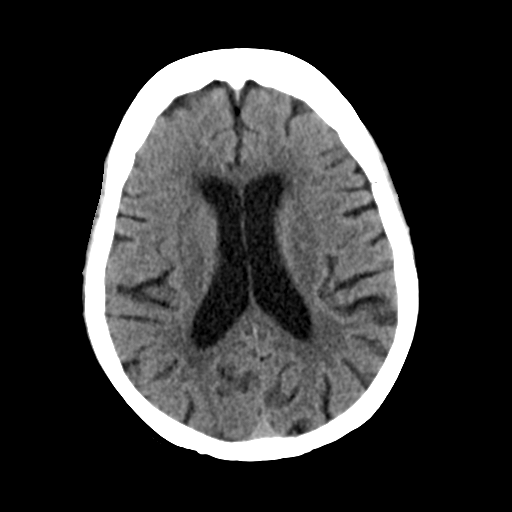
[im 19/34  bone]
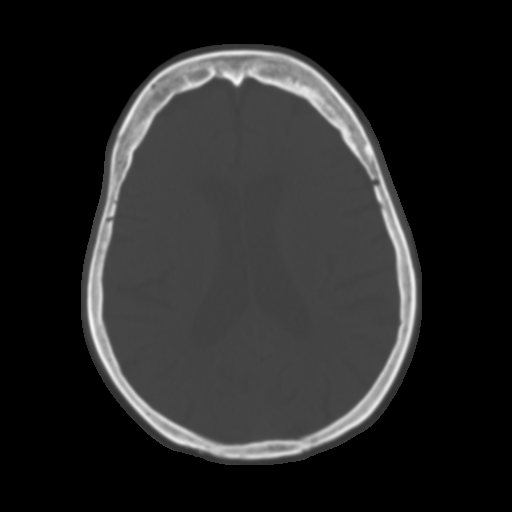
[im 23/34  brain]
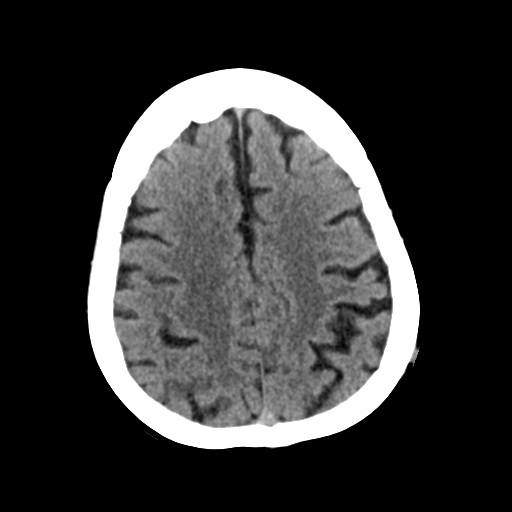
[im 27/34  brain]
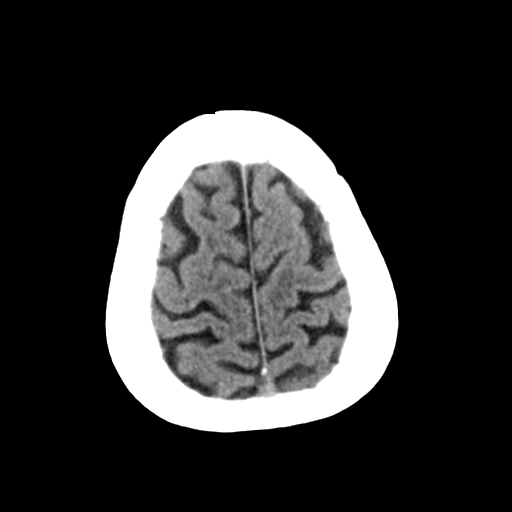
[im 31/34  brain]
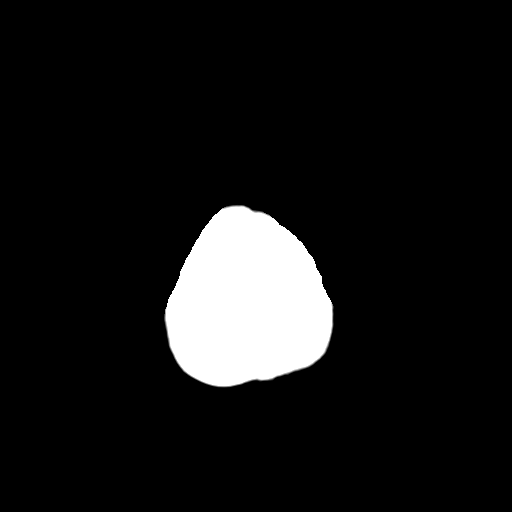

[Series 5: head 3.0 mpr cor · coronal · 0.32mm/px · 3 of 67 slices shown]
[im 23/67  brain]
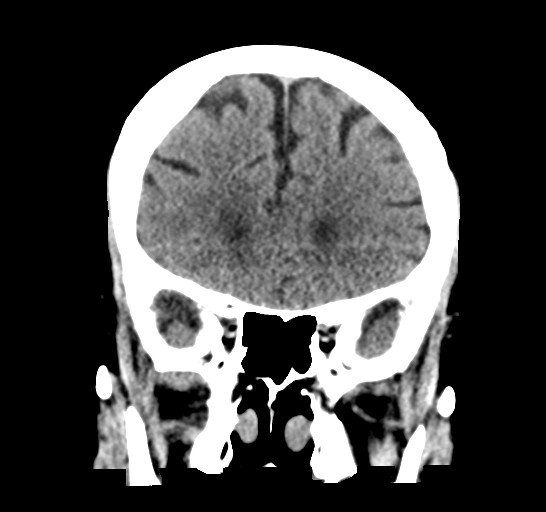
[im 30/67  brain]
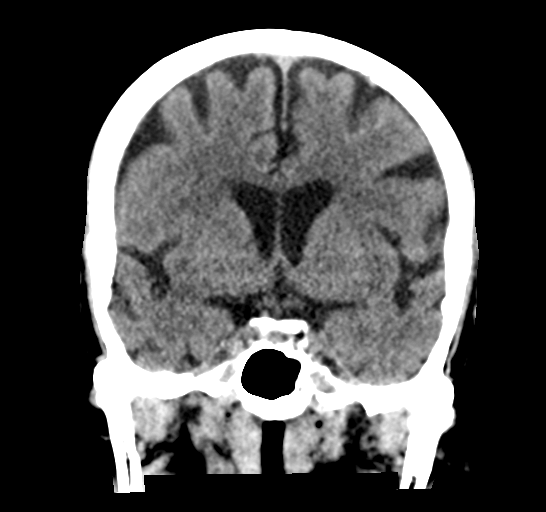
[im 37/67  brain]
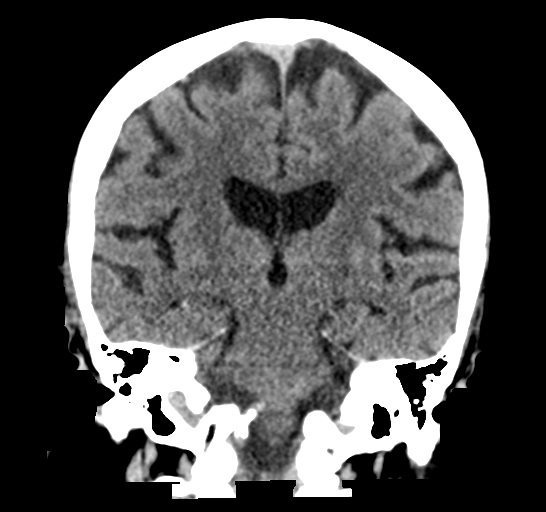

[Series 6: head 3.0 mpr sag · sagittal · 0.32mm/px · 3 of 60 slices shown]
[im 20/60  brain]
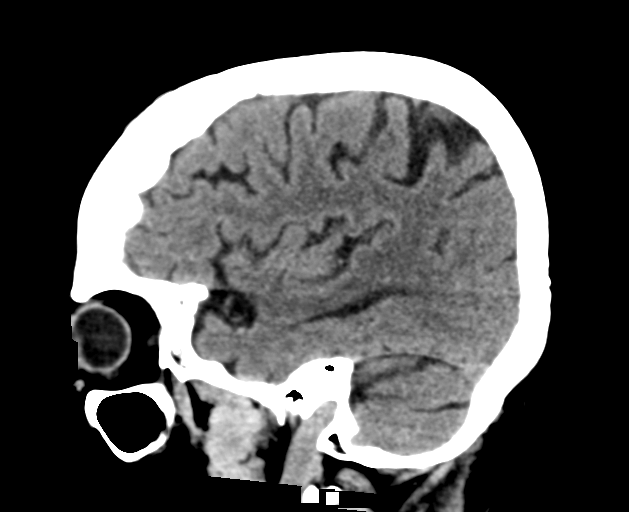
[im 30/60  brain]
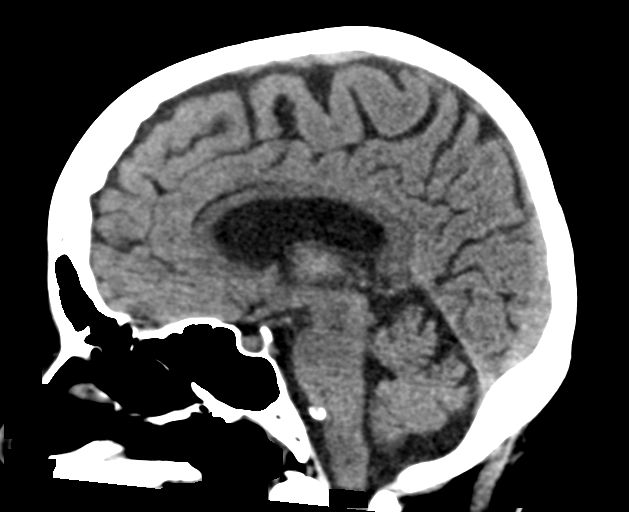
[im 40/60  brain]
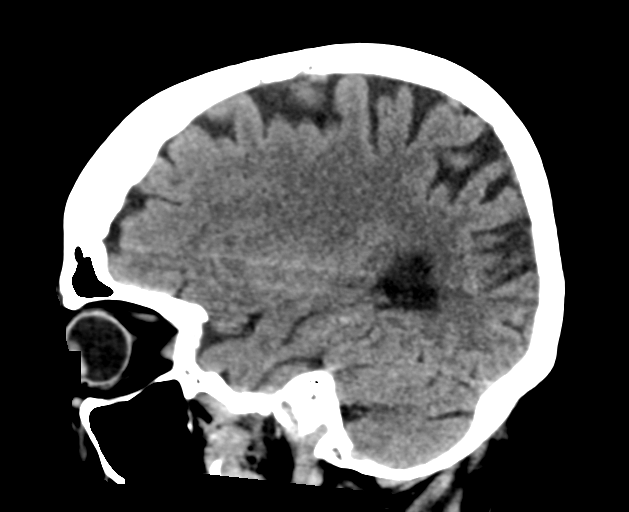

[14 of 47 positions shown; findings below may reference images not displayed]

FINDINGS: CT HEAD FINDINGS

Brain: No evidence of acute infarction, hemorrhage, hydrocephalus,
extra-axial collection or mass lesion/mass effect. Mineralization in
the bilateral basal ganglia. Periventricular white matter changes,
likely the sequela of chronic small vessel ischemic disease.

Vascular: No hyperdense vessel. Atherosclerotic calcifications in
the intracranial carotid and vertebral arteries.

Skull: Hyperostosis frontalis. Negative for fracture or focal
lesion.

Other: None.

CT MAXILLOFACIAL FINDINGS

Osseous: No fracture or mandibular dislocation. No destructive
process.

Orbits: Negative. No traumatic or inflammatory finding.

Sinuses: Bubbly fluid in the right maxillary sinus. Otherwise clear.
The mastoids are well aerated.

Soft tissues: No large hematoma or laceration.

CT CERVICAL SPINE FINDINGS

Alignment: Straightening and mild reversal of the normal cervical
lordosis. Trace anterolisthesis of C3 on C4 and trace retrolisthesis
of C5 on C6, which appears degenerative.

Skull base and vertebrae: No acute fracture. No primary bone lesion
or focal pathologic process.

Soft tissues and spinal canal: No prevertebral fluid or swelling. No
visible canal hematoma.

Disc levels: Multilevel degenerative changes, with mild spinal canal
stenosis and C4-C5 and C5-C6, and moderate spinal canal stenosis at
C6-C7. Multilevel uncovertebral and facet arthropathy, which causes
up to severe neural foraminal narrowing bilaterally at C3-C4 and on
the right at C4-C5 and C5-C6.

Upper chest: Focal pulmonary opacity or pleural effusion.

Other: None.
IMPRESSION: 1.  No acute intracranial process.
2. No acute facial bone fracture.
3.  No acute fracture or traumatic listhesis in the cervical spine.
4. Bubbly fluid in the right maxillary sinus, as can be seen with
acute sinusitis. Correlate with symptoms.

## 2022-09-27 IMAGING — CT CT MAXILLOFACIAL W/O CM
3 of 4 series · 14 of 47 positions shown, 16 images · non-contrast
Comparison: None Available.

CLINICAL DATA: Fall



[Series 3: facial/ orbits 2.0 h30s · axial · 0.34mm/px · z∈[-348,-220]mm · 8 of 84 slices shown, 10 images]
[im 10/84  brain]
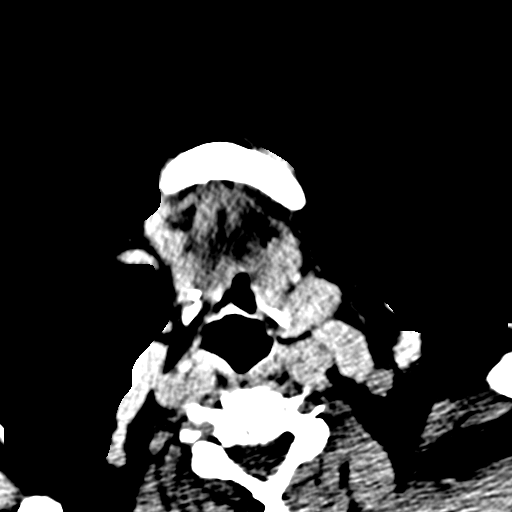
[im 10/84  bone]
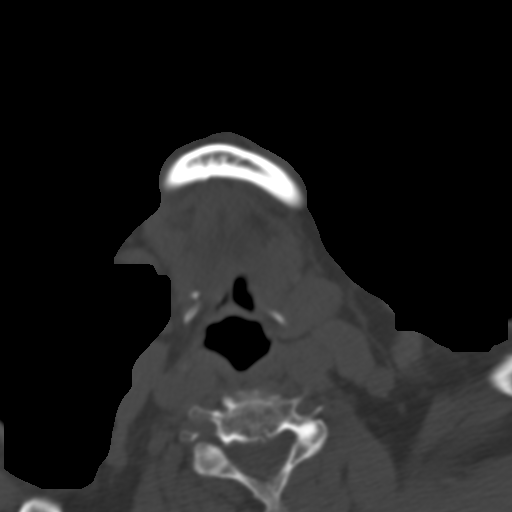
[im 19/84  bone]
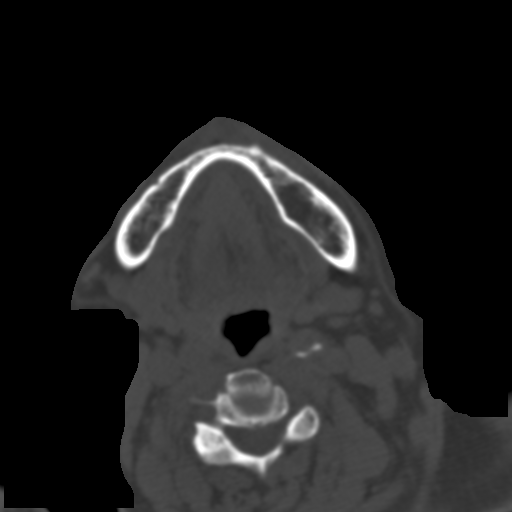
[im 28/84  bone]
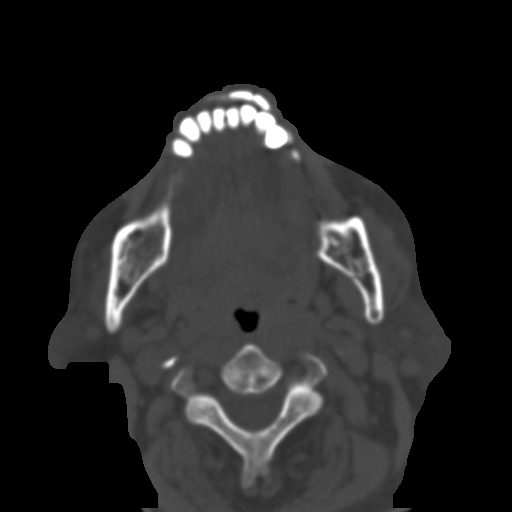
[im 37/84  bone]
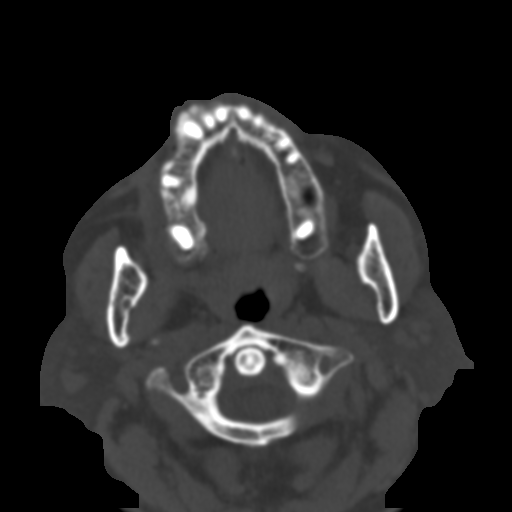
[im 47/84  brain]
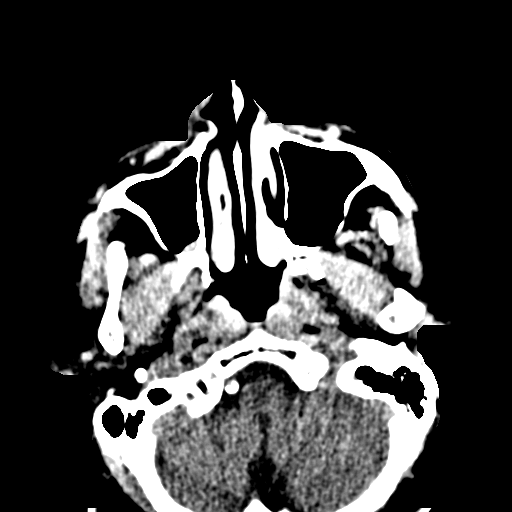
[im 47/84  bone]
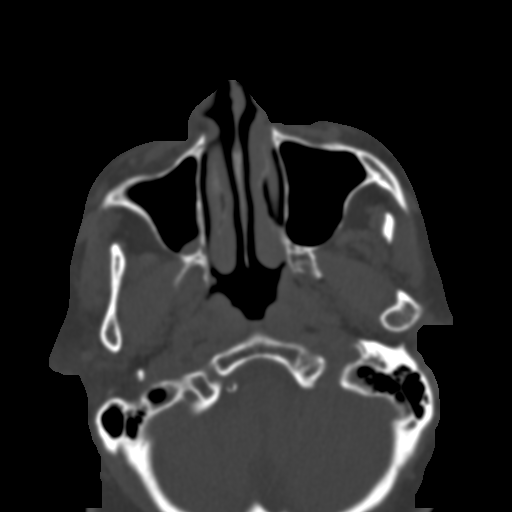
[im 56/84  bone]
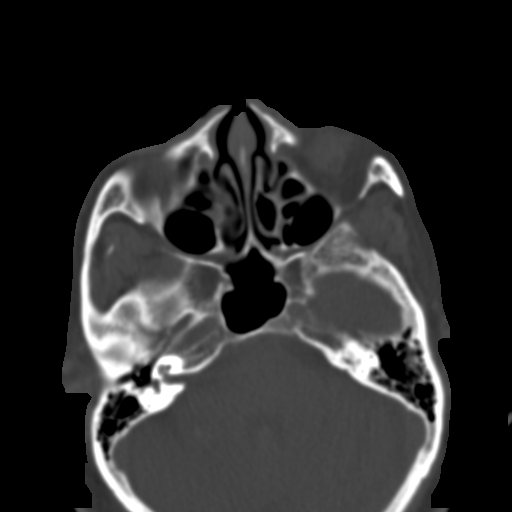
[im 65/84  bone]
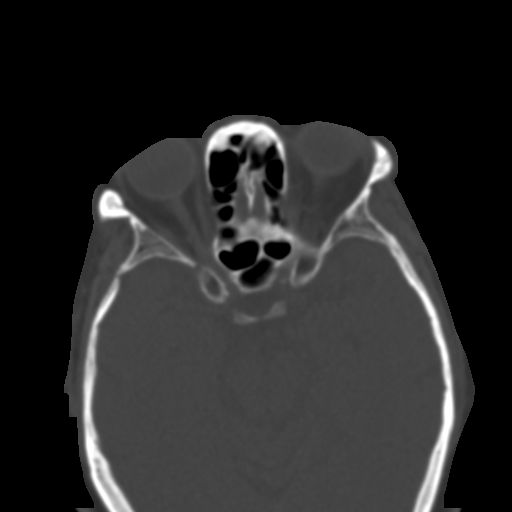
[im 74/84  bone]
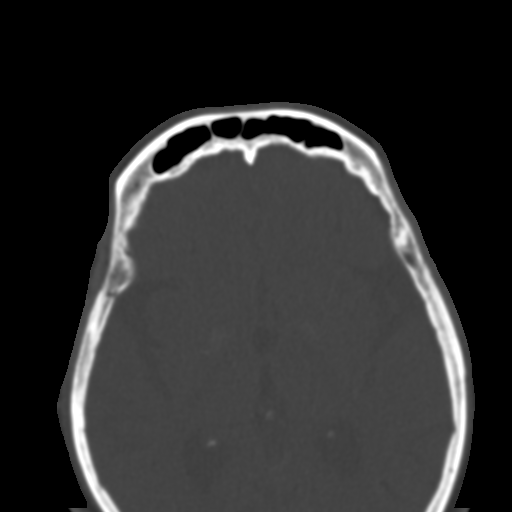

[Series 7: coronal soft tissue · coronal · 0.36mm/px · 3 of 76 slices shown]
[im 26/76  bone]
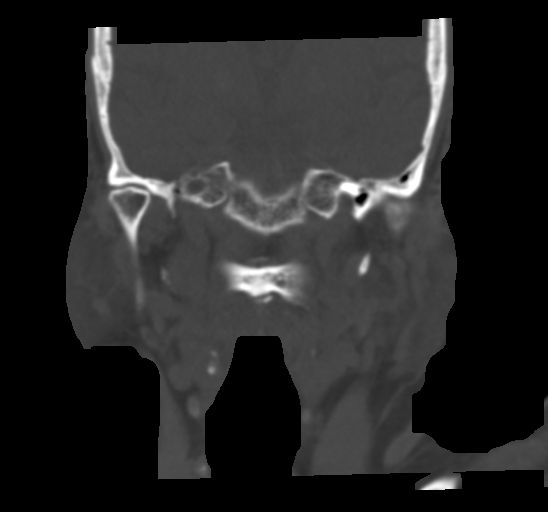
[im 34/76  bone]
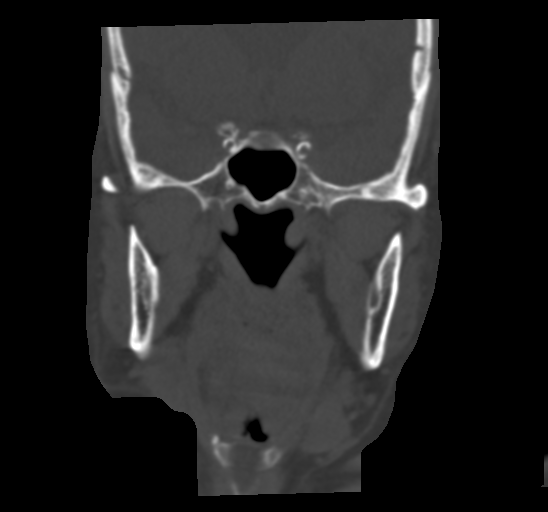
[im 42/76  bone]
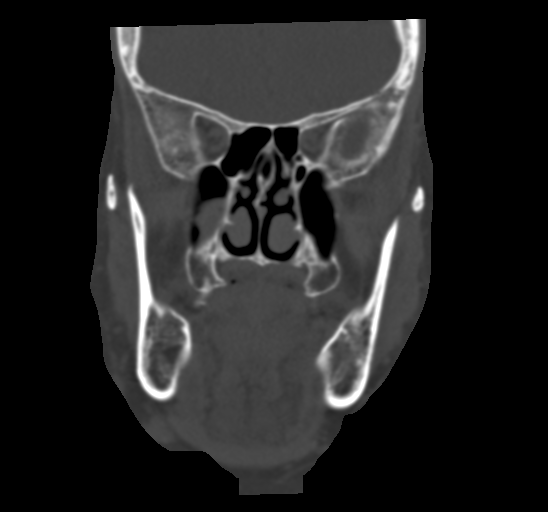

[Series 8: sagittal soft tissue · sagittal · 0.31mm/px · 3 of 89 slices shown]
[im 30/89  bone]
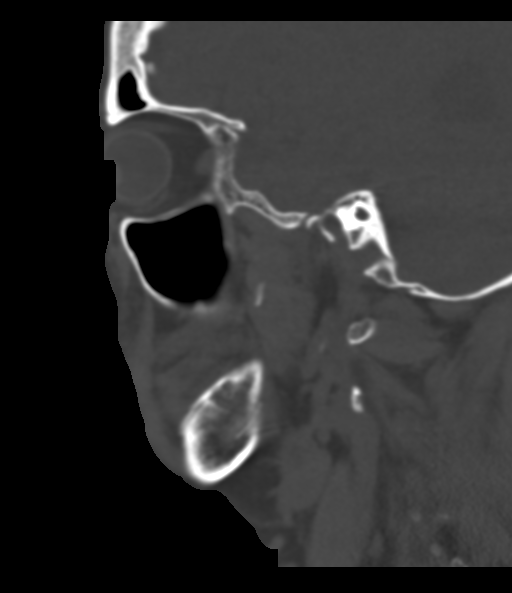
[im 45/89  bone]
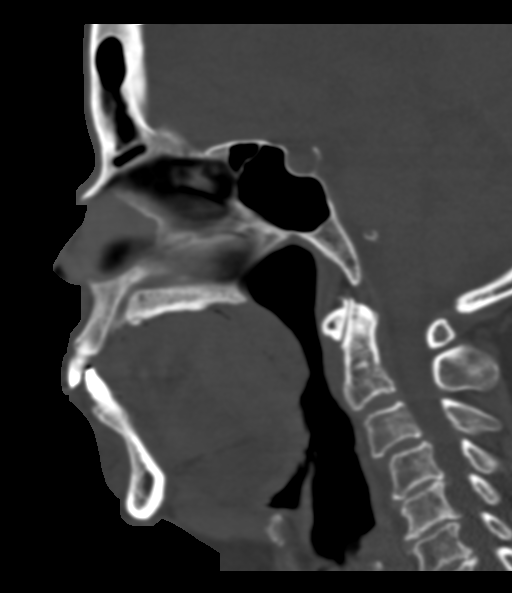
[im 59/89  bone]
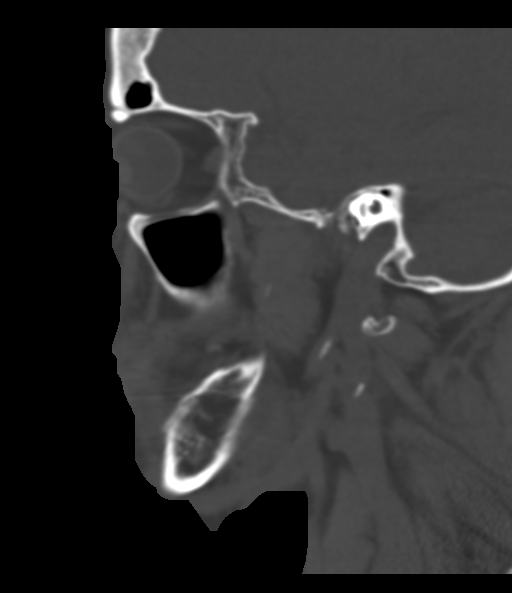

[14 of 47 positions shown; findings below may reference images not displayed]

FINDINGS: CT HEAD FINDINGS

Brain: No evidence of acute infarction, hemorrhage, hydrocephalus,
extra-axial collection or mass lesion/mass effect. Mineralization in
the bilateral basal ganglia. Periventricular white matter changes,
likely the sequela of chronic small vessel ischemic disease.

Vascular: No hyperdense vessel. Atherosclerotic calcifications in
the intracranial carotid and vertebral arteries.

Skull: Hyperostosis frontalis. Negative for fracture or focal
lesion.

Other: None.

CT MAXILLOFACIAL FINDINGS

Osseous: No fracture or mandibular dislocation. No destructive
process.

Orbits: Negative. No traumatic or inflammatory finding.

Sinuses: Bubbly fluid in the right maxillary sinus. Otherwise clear.
The mastoids are well aerated.

Soft tissues: No large hematoma or laceration.

CT CERVICAL SPINE FINDINGS

Alignment: Straightening and mild reversal of the normal cervical
lordosis. Trace anterolisthesis of C3 on C4 and trace retrolisthesis
of C5 on C6, which appears degenerative.

Skull base and vertebrae: No acute fracture. No primary bone lesion
or focal pathologic process.

Soft tissues and spinal canal: No prevertebral fluid or swelling. No
visible canal hematoma.

Disc levels: Multilevel degenerative changes, with mild spinal canal
stenosis and C4-C5 and C5-C6, and moderate spinal canal stenosis at
C6-C7. Multilevel uncovertebral and facet arthropathy, which causes
up to severe neural foraminal narrowing bilaterally at C3-C4 and on
the right at C4-C5 and C5-C6.

Upper chest: Focal pulmonary opacity or pleural effusion.

Other: None.
IMPRESSION: 1.  No acute intracranial process.
2. No acute facial bone fracture.
3.  No acute fracture or traumatic listhesis in the cervical spine.
4. Bubbly fluid in the right maxillary sinus, as can be seen with
acute sinusitis. Correlate with symptoms.

## 2022-12-29 ENCOUNTER — Ambulatory Visit: Payer: 59

## 2022-12-29 ENCOUNTER — Ambulatory Visit: Payer: Self-pay | Admitting: Internal Medicine

## 2022-12-29 NOTE — Progress Notes (Signed)
No show

## 2023-04-23 NOTE — Progress Notes (Unsigned)
Error
